# Patient Record
Sex: Female | Born: 1960 | Race: White | Hispanic: No | Marital: Married | Smoking: Never smoker
Health system: Southern US, Community
[De-identification: ages and names within clinical notes are randomized; demographics above are authoritative.]

## PROBLEM LIST (undated history)

## (undated) DIAGNOSIS — I10 Essential (primary) hypertension: Secondary | ICD-10-CM

## (undated) DIAGNOSIS — C801 Malignant (primary) neoplasm, unspecified: Secondary | ICD-10-CM

## (undated) DIAGNOSIS — N3281 Overactive bladder: Secondary | ICD-10-CM

## (undated) DIAGNOSIS — M1990 Unspecified osteoarthritis, unspecified site: Secondary | ICD-10-CM

## (undated) HISTORY — PX: RECONSTRUCTION BREAST W/ LATISSIMUS DORSI FLAP: SUR1078

## (undated) HISTORY — PX: TOTAL KNEE ARTHROPLASTY: SHX125

## (undated) HISTORY — PX: MASTECTOMY: SHX3

## (undated) HISTORY — PX: TOTAL HIP ARTHROPLASTY: SHX124

## (undated) HISTORY — PX: LUMBAR FUSION: SHX111

## (undated) HISTORY — PX: CERVICAL SPINE SURGERY: SHX589

---

## 1998-01-04 ENCOUNTER — Other Ambulatory Visit: Admission: RE | Admit: 1998-01-04 | Discharge: 1998-01-04 | Payer: Self-pay | Admitting: *Deleted

## 1999-02-01 ENCOUNTER — Other Ambulatory Visit: Admission: RE | Admit: 1999-02-01 | Discharge: 1999-02-01 | Payer: Self-pay | Admitting: *Deleted

## 2000-03-03 ENCOUNTER — Other Ambulatory Visit: Admission: RE | Admit: 2000-03-03 | Discharge: 2000-03-03 | Payer: Self-pay | Admitting: *Deleted

## 2001-05-05 ENCOUNTER — Other Ambulatory Visit: Admission: RE | Admit: 2001-05-05 | Discharge: 2001-05-05 | Payer: Self-pay | Admitting: *Deleted

## 2002-05-06 ENCOUNTER — Other Ambulatory Visit: Admission: RE | Admit: 2002-05-06 | Discharge: 2002-05-06 | Payer: Self-pay | Admitting: *Deleted

## 2003-05-03 ENCOUNTER — Other Ambulatory Visit: Admission: RE | Admit: 2003-05-03 | Discharge: 2003-05-03 | Payer: Self-pay | Admitting: *Deleted

## 2004-08-01 ENCOUNTER — Other Ambulatory Visit: Admission: RE | Admit: 2004-08-01 | Discharge: 2004-08-01 | Payer: Self-pay | Admitting: *Deleted

## 2005-08-14 ENCOUNTER — Other Ambulatory Visit: Admission: RE | Admit: 2005-08-14 | Discharge: 2005-08-14 | Payer: Self-pay | Admitting: *Deleted

## 2006-08-12 ENCOUNTER — Other Ambulatory Visit: Admission: RE | Admit: 2006-08-12 | Discharge: 2006-08-12 | Payer: Self-pay | Admitting: *Deleted

## 2020-12-08 ENCOUNTER — Other Ambulatory Visit: Payer: Self-pay | Admitting: Orthopaedic Surgery

## 2020-12-08 DIAGNOSIS — M25512 Pain in left shoulder: Secondary | ICD-10-CM

## 2020-12-16 ENCOUNTER — Ambulatory Visit
Admission: RE | Admit: 2020-12-16 | Discharge: 2020-12-16 | Disposition: A | Discharge status: Home / Self Care | Payer: Self-pay | Source: Ambulatory Visit | Attending: Orthopaedic Surgery | Admitting: Orthopaedic Surgery

## 2020-12-16 ENCOUNTER — Other Ambulatory Visit: Payer: Self-pay

## 2020-12-16 DIAGNOSIS — M25512 Pain in left shoulder: Secondary | ICD-10-CM

## 2021-01-30 ENCOUNTER — Encounter (HOSPITAL_BASED_OUTPATIENT_CLINIC_OR_DEPARTMENT_OTHER): Payer: Self-pay | Admitting: Orthopaedic Surgery

## 2021-01-30 ENCOUNTER — Other Ambulatory Visit: Payer: Self-pay

## 2021-02-06 ENCOUNTER — Encounter (HOSPITAL_BASED_OUTPATIENT_CLINIC_OR_DEPARTMENT_OTHER)
Admission: RE | Admit: 2021-02-06 | Discharge: 2021-02-06 | Disposition: A | Discharge status: Home / Self Care | Payer: Medicare Other | Source: Ambulatory Visit | Attending: Orthopaedic Surgery | Admitting: Orthopaedic Surgery

## 2021-02-06 DIAGNOSIS — I10 Essential (primary) hypertension: Secondary | ICD-10-CM | POA: Diagnosis not present

## 2021-02-06 DIAGNOSIS — M19012 Primary osteoarthritis, left shoulder: Secondary | ICD-10-CM | POA: Diagnosis not present

## 2021-02-06 DIAGNOSIS — Z01818 Encounter for other preprocedural examination: Secondary | ICD-10-CM | POA: Diagnosis not present

## 2021-02-06 DIAGNOSIS — Z96652 Presence of left artificial knee joint: Secondary | ICD-10-CM | POA: Diagnosis not present

## 2021-02-06 DIAGNOSIS — Z79899 Other long term (current) drug therapy: Secondary | ICD-10-CM | POA: Diagnosis not present

## 2021-02-06 DIAGNOSIS — S42142A Displaced fracture of glenoid cavity of scapula, left shoulder, initial encounter for closed fracture: Secondary | ICD-10-CM | POA: Diagnosis not present

## 2021-02-06 DIAGNOSIS — Z9889 Other specified postprocedural states: Secondary | ICD-10-CM | POA: Diagnosis not present

## 2021-02-06 DIAGNOSIS — C50912 Malignant neoplasm of unspecified site of left female breast: Secondary | ICD-10-CM | POA: Diagnosis not present

## 2021-02-06 DIAGNOSIS — Z96641 Presence of right artificial hip joint: Secondary | ICD-10-CM | POA: Diagnosis not present

## 2021-02-06 DIAGNOSIS — X58XXXA Exposure to other specified factors, initial encounter: Secondary | ICD-10-CM | POA: Diagnosis not present

## 2021-02-06 DIAGNOSIS — Z79811 Long term (current) use of aromatase inhibitors: Secondary | ICD-10-CM | POA: Diagnosis not present

## 2021-02-06 DIAGNOSIS — Z7983 Long term (current) use of bisphosphonates: Secondary | ICD-10-CM | POA: Diagnosis not present

## 2021-02-06 LAB — BASIC METABOLIC PANEL
Anion gap: 9 (ref 5–15)
BUN: 15 mg/dL (ref 8–23)
CO2: 27 mmol/L (ref 22–32)
Calcium: 9.8 mg/dL (ref 8.9–10.3)
Chloride: 94 mmol/L — ABNORMAL LOW (ref 98–111)
Creatinine, Ser: 0.84 mg/dL (ref 0.44–1.00)
GFR, Estimated: >60 mL/min (ref >60)
Glucose, Bld: 96 mg/dL (ref 70–99)
Potassium: 3.9 mmol/L (ref 3.5–5.1)
Sodium: 130 mmol/L — ABNORMAL LOW (ref 135–145)

## 2021-02-06 LAB — SURGICAL PCR SCREEN
MRSA, PCR: NEGATIVE
Staphylococcus aureus: NEGATIVE

## 2021-02-06 NOTE — Progress Notes (Signed)
      Enhanced Recovery after Surgery for Orthopedics Enhanced Recovery after Surgery is a protocol used to improve the stress on your body and your recovery after surgery.  Patient Instructions  The night before surgery:  No food after midnight. ONLY clear liquids after midnight  The day of surgery (if you do NOT have diabetes):  Drink ONE (1) Pre-Surgery Clear Ensure as directed.   This drink was given to you during your hospital  pre-op appointment visit. The pre-op nurse will instruct you on the time to drink the  Pre-Surgery Ensure depending on your surgery time. Finish the drink at the designated time by the pre-op nurse.  Nothing else to drink after completing the  Pre-Surgery Clear Ensure.  The day of surgery (if you have diabetes): Drink ONE (1) Gatorade 2 (G2) as directed. This drink was given to you during your hospital  pre-op appointment visit.  The pre-op nurse will instruct you on the time to drink the   Gatorade 2 (G2) depending on your surgery time. Color of the Gatorade may vary. Red is not allowed. Nothing else to drink after completing the  Gatorade 2 (G2).         If you have questions, please contact your surgeon's office. Surgical soap given with instructions, pt verbalized understanding. Benzoyl peroxide gel given with written instructions, pt verbalized understanding.

## 2021-02-06 NOTE — Progress Notes (Signed)
Sent text reminding pt to come in for lab work today.

## 2021-02-06 NOTE — H&P (Signed)
PREOPERATIVE H&P  Chief Complaint: DJD LEFT SHOULDER  HPI: Pam Jarvis is a 60 y.o. female who is scheduled for Procedure(s): REVERSE SHOULDER ARTHROPLASTY.   Patient has a past medical history significant for breast cancer, HTN.   Patient has a reported history of bilateral rotator cuff repairs done by Dr. Jolinda Croak in [facility].  She has had trouble with her shoulders for many years.  It has been worsening over the last few years. She is an avid [employer] cabin [employer]. She has had injections to her bilateral shoulders, which only provides minimal improvement. She is frustrated by her shoulder.  Her symptoms are rated as moderate to severe, and have been worsening.  This is significantly impairing activities of daily living.    Please see clinic note for further details on this patient's care.    She has elected for surgical management.   Past Medical History:  Diagnosis Date   Cancer ([place])    left breast cancer-bil mastectomies   Hypertension    OAB (overactive bladder)    Osteoarthritis    Past Surgical History:  Procedure Laterality Date   CERVICAL SPINE SURGERY     LUMBAR FUSION     MASTECTOMY Bilateral    RECONSTRUCTION BREAST W/ LATISSIMUS DORSI FLAP     TOTAL HIP ARTHROPLASTY Right    TOTAL KNEE ARTHROPLASTY Left    Social History   Socioeconomic History   Marital status: Married    Spouse name: Not on file   Number of children: Not on file   Years of education: Not on file   Highest education level: Not on file  Occupational History   Not on file  Tobacco Use   Smoking status: Never   Smokeless tobacco: Never  Vaping Use   Vaping Use: Never used  Substance and Sexual Activity   Alcohol use: Yes    Comment: social   Drug use: Never   Sexual activity: Yes    Birth control/protection: Post-menopausal  Other Topics Concern   Not on file  Social History Narrative   Not on file   Social Determinants of Health   Financial Resource Strain: Not on file   Food Insecurity: Not on file  Transportation Needs: Not on file  Physical Activity: Not on file  Stress: Not on file  Social Connections: Not on file   History reviewed. No pertinent family history. No Known Allergies Prior to Admission medications   Medication Sig Start Date End Date Taking? Authorizing Provider  amLODipine (NORVASC) 5 MG tablet Take 5 mg by mouth daily.   Yes [provider]  doxepin (SINEQUAN) 25 MG capsule Take 25 mg by mouth at bedtime.   Yes [provider]  exemestane (AROMASIN) 25 MG tablet Take 25 mg by mouth daily after breakfast.   Yes [provider]  ibandronate (BONIVA) 150 MG tablet Take 150 mg by mouth every 30 (thirty) days. Take in the morning with a full glass of water, on an empty stomach, and do not take anything else by mouth or lie down for the next 30 min.   Yes [provider]  ibuprofen (ADVIL) 800 MG tablet Take 800 mg by mouth every 8 (eight) hours as needed.   Yes [provider]  metoprolol succinate (TOPROL-XL) 25 MG 24 hr tablet Take 12.5 mg by mouth daily.   Yes [provider]  oxybutynin (DITROPAN-XL) 10 MG 24 hr tablet Take 10 mg by mouth at bedtime.   Yes [provider]  quinapril (ACCUPRIL) 20 MG tablet Take 20 mg by mouth at bedtime.   Yes [provider]  triamterene-hydrochlorothiazide (DYAZIDE) 37.5-25 MG capsule Take 1 capsule by mouth daily.   Yes [provider]  venlafaxine (EFFEXOR) 100 MG tablet Take 225 mg by mouth daily.   Yes [provider]  zolpidem (AMBIEN) 10 MG tablet Take 20 mg by mouth at bedtime as needed for sleep.   Yes [provider]    ROS: All other systems have been reviewed and were otherwise negative with the exception of those mentioned in the HPI and as above.  Physical Exam: General: Alert, no acute distress Cardiovascular: No pedal edema Respiratory: No cyanosis, no use of accessory musculature GI:  No organomegaly, abdomen is soft and non-tender Skin: No lesions in the area of chief complaint Neurologic: Sensation intact distally Psychiatric: Patient is competent for consent with normal mood and affect Lymphatic: No axillary or cervical lymphadenopathy  MUSCULOSKELETAL:  Range of motion of bilateral shoulders is to 140 degrees.  External rotation to 40 degrees on the right, 30 degrees on the left.  Internal rotation to T8 on the right and T5 on the left.  Cuff strength is 5/5 bilaterally.    Imaging: X-rays demonstrate end stage glenohumeral arthritis of bilateral shoulders with no obvious proximal migration.  CT of the left shoulder demonstrates severe glenohumeral arthritis  Assessment: DJD LEFT SHOULDER  Plan: Plan for Procedure(s): REVERSE SHOULDER ARTHROPLASTY  The risks benefits and alternatives were discussed with the patient including but not limited to the risks of nonoperative treatment, versus surgical intervention including infection, bleeding, nerve injury,  blood clots, cardiopulmonary complications, morbidity, mortality, among others, and they were willing to proceed.   We additionally specifically discussed risks of axillary nerve injury, infection, periprosthetic fracture, continued pain and longevity of implants prior to beginning procedure.    Patient will be closely monitored in PACU for medical stabilization and pain control. If found stable in PACU, patient may be discharged home with outpatient follow-up. If any concerns regarding patient's stabilization patient will be admitted for observation after surgery. The patient is planning to be discharged home with outpatient PT.   The patient acknowledged the explanation, agreed to proceed with the plan and consent was signed.   Operative Plan: Left anatomic versus reverse total shoulder arthroplasty  Discharge Medications: standard DVT Prophylaxis: aspirin Physical Therapy: outpatient PT Special Discharge  needs: sling. Iceman.   Ethelda Chick, PA-C  02/06/2021 9:31 PM

## 2021-02-07 ENCOUNTER — Encounter (HOSPITAL_BASED_OUTPATIENT_CLINIC_OR_DEPARTMENT_OTHER)
Admission: RE | Disposition: A | Discharge status: Home / Self Care | Payer: Self-pay | Source: Home / Self Care | Attending: Orthopaedic Surgery

## 2021-02-07 ENCOUNTER — Encounter (HOSPITAL_BASED_OUTPATIENT_CLINIC_OR_DEPARTMENT_OTHER): Payer: Self-pay | Admitting: Orthopaedic Surgery

## 2021-02-07 ENCOUNTER — Ambulatory Visit (HOSPITAL_BASED_OUTPATIENT_CLINIC_OR_DEPARTMENT_OTHER): Payer: Medicare Other | Admitting: Certified Registered"

## 2021-02-07 ENCOUNTER — Ambulatory Visit (HOSPITAL_COMMUNITY): Payer: Medicare Other

## 2021-02-07 ENCOUNTER — Other Ambulatory Visit: Payer: Self-pay

## 2021-02-07 ENCOUNTER — Ambulatory Visit (HOSPITAL_BASED_OUTPATIENT_CLINIC_OR_DEPARTMENT_OTHER)
Admission: RE | Admit: 2021-02-07 | Discharge: 2021-02-07 | Disposition: A | Discharge status: Home / Self Care | Payer: Medicare Other | Attending: Orthopaedic Surgery | Admitting: Orthopaedic Surgery

## 2021-02-07 DIAGNOSIS — Z96641 Presence of right artificial hip joint: Secondary | ICD-10-CM | POA: Insufficient documentation

## 2021-02-07 DIAGNOSIS — I10 Essential (primary) hypertension: Secondary | ICD-10-CM | POA: Diagnosis not present

## 2021-02-07 DIAGNOSIS — Z96652 Presence of left artificial knee joint: Secondary | ICD-10-CM | POA: Insufficient documentation

## 2021-02-07 DIAGNOSIS — C50912 Malignant neoplasm of unspecified site of left female breast: Secondary | ICD-10-CM | POA: Diagnosis not present

## 2021-02-07 DIAGNOSIS — Z79899 Other long term (current) drug therapy: Secondary | ICD-10-CM | POA: Insufficient documentation

## 2021-02-07 DIAGNOSIS — M19012 Primary osteoarthritis, left shoulder: Secondary | ICD-10-CM | POA: Insufficient documentation

## 2021-02-07 DIAGNOSIS — Z79811 Long term (current) use of aromatase inhibitors: Secondary | ICD-10-CM | POA: Insufficient documentation

## 2021-02-07 DIAGNOSIS — X58XXXA Exposure to other specified factors, initial encounter: Secondary | ICD-10-CM | POA: Insufficient documentation

## 2021-02-07 DIAGNOSIS — Z09 Encounter for follow-up examination after completed treatment for conditions other than malignant neoplasm: Secondary | ICD-10-CM

## 2021-02-07 DIAGNOSIS — S42142A Displaced fracture of glenoid cavity of scapula, left shoulder, initial encounter for closed fracture: Secondary | ICD-10-CM | POA: Insufficient documentation

## 2021-02-07 DIAGNOSIS — Z7983 Long term (current) use of bisphosphonates: Secondary | ICD-10-CM | POA: Insufficient documentation

## 2021-02-07 DIAGNOSIS — Z9889 Other specified postprocedural states: Secondary | ICD-10-CM | POA: Insufficient documentation

## 2021-02-07 HISTORY — DX: Essential (primary) hypertension: I10

## 2021-02-07 HISTORY — DX: Overactive bladder: N32.81

## 2021-02-07 HISTORY — PX: REVERSE SHOULDER ARTHROPLASTY: SHX5054

## 2021-02-07 HISTORY — DX: Malignant (primary) neoplasm, unspecified: C80.1

## 2021-02-07 HISTORY — DX: Unspecified osteoarthritis, unspecified site: M19.90

## 2021-02-07 SURGERY — ARTHROPLASTY, SHOULDER, TOTAL, REVERSE
Anesthesia: General | Site: Shoulder | Laterality: Left

## 2021-02-07 MED ORDER — ONDANSETRON HCL 4 MG/2ML IJ SOLN
INTRAMUSCULAR | Status: AC
  Filled 2021-02-07: qty 2

## 2021-02-07 MED ORDER — GABAPENTIN 300 MG PO CAPS
ORAL_CAPSULE | ORAL | Status: AC
  Filled 2021-02-07: qty 1

## 2021-02-07 MED ORDER — BUPIVACAINE HCL (PF) 0.5 % IJ SOLN
INTRAMUSCULAR | Status: DC | PRN
  Administered 2021-02-07: 20 mL via PERINEURAL

## 2021-02-07 MED ORDER — CEFAZOLIN SODIUM-DEXTROSE 2-4 GM/100ML-% IV SOLN
INTRAVENOUS | Status: AC
  Filled 2021-02-07: qty 100

## 2021-02-07 MED ORDER — GABAPENTIN 100 MG PO CAPS: 100.0000 mg | ORAL_CAPSULE | Freq: Three times a day (TID) | ORAL | 0 refills | Status: AC

## 2021-02-07 MED ORDER — LACTATED RINGERS IV SOLN: INTRAVENOUS | Status: DC

## 2021-02-07 MED ORDER — BUPIVACAINE LIPOSOME 1.3 % IJ SUSP
INTRAMUSCULAR | Status: DC | PRN
  Administered 2021-02-07: 10 mL via PERINEURAL

## 2021-02-07 MED ORDER — HYDROMORPHONE HCL 1 MG/ML IJ SOLN: 0.2500 mg | INTRAMUSCULAR | Status: DC | PRN

## 2021-02-07 MED ORDER — EPHEDRINE SULFATE 50 MG/ML IJ SOLN
INTRAMUSCULAR | Status: DC | PRN
  Administered 2021-02-07: 15 mg via INTRAVENOUS
  Administered 2021-02-07 (×2): 10 mg via INTRAVENOUS
  Administered 2021-02-07: 15 mg via INTRAVENOUS

## 2021-02-07 MED ORDER — FENTANYL CITRATE (PF) 100 MCG/2ML IJ SOLN
100.0000 ug | Freq: Once | INTRAMUSCULAR | Status: AC
  Administered 2021-02-07: 100 ug via INTRAVENOUS

## 2021-02-07 MED ORDER — VANCOMYCIN HCL 1000 MG IV SOLR
INTRAVENOUS | Status: DC | PRN
  Administered 2021-02-07: 1000 mg via TOPICAL

## 2021-02-07 MED ORDER — DEXAMETHASONE SODIUM PHOSPHATE 10 MG/ML IJ SOLN
INTRAMUSCULAR | Status: DC | PRN
  Administered 2021-02-07: 10 mg via INTRAVENOUS

## 2021-02-07 MED ORDER — ROCURONIUM BROMIDE 10 MG/ML (PF) SYRINGE
PREFILLED_SYRINGE | INTRAVENOUS | Status: AC
  Filled 2021-02-07: qty 10

## 2021-02-07 MED ORDER — PHENYLEPHRINE HCL (PRESSORS) 10 MG/ML IV SOLN
INTRAVENOUS | Status: DC | PRN
  Administered 2021-02-07 (×2): 120 ug via INTRAVENOUS
  Administered 2021-02-07: 40 ug via INTRAVENOUS
  Administered 2021-02-07: 120 ug via INTRAVENOUS

## 2021-02-07 MED ORDER — TRANEXAMIC ACID-NACL 1000-0.7 MG/100ML-% IV SOLN
INTRAVENOUS | Status: AC
  Filled 2021-02-07: qty 100

## 2021-02-07 MED ORDER — LACTATED RINGERS IV BOLUS
500.0000 mL | Freq: Once | INTRAVENOUS | Status: AC
  Administered 2021-02-07: 500 mL via INTRAVENOUS

## 2021-02-07 MED ORDER — LACTATED RINGERS IV BOLUS: 250.0000 mL | Freq: Once | INTRAVENOUS | Status: DC

## 2021-02-07 MED ORDER — LIDOCAINE 2% (20 MG/ML) 5 ML SYRINGE
INTRAMUSCULAR | Status: DC | PRN
  Administered 2021-02-07: 40 mg via INTRAVENOUS

## 2021-02-07 MED ORDER — OMEPRAZOLE 20 MG PO CPDR: 20.0000 mg | DELAYED_RELEASE_CAPSULE | Freq: Every day | ORAL | 0 refills | Status: AC

## 2021-02-07 MED ORDER — ASPIRIN 81 MG PO CHEW: 81.0000 mg | CHEWABLE_TABLET | Freq: Two times a day (BID) | ORAL | 0 refills | Status: AC

## 2021-02-07 MED ORDER — DEXAMETHASONE SODIUM PHOSPHATE 10 MG/ML IJ SOLN
INTRAMUSCULAR | Status: AC
  Filled 2021-02-07: qty 1

## 2021-02-07 MED ORDER — ACETAMINOPHEN 500 MG PO TABS
ORAL_TABLET | ORAL | Status: AC
  Filled 2021-02-07: qty 2

## 2021-02-07 MED ORDER — CEFAZOLIN SODIUM-DEXTROSE 2-4 GM/100ML-% IV SOLN
2.0000 g | INTRAVENOUS | Status: AC
  Administered 2021-02-07: 2 g via INTRAVENOUS

## 2021-02-07 MED ORDER — VANCOMYCIN HCL 1000 MG IV SOLR
INTRAVENOUS | Status: AC
  Filled 2021-02-07: qty 20

## 2021-02-07 MED ORDER — CELECOXIB 100 MG PO CAPS: 100.0000 mg | ORAL_CAPSULE | Freq: Two times a day (BID) | ORAL | 0 refills | Status: AC

## 2021-02-07 MED ORDER — BUPIVACAINE HCL (PF) 0.25 % IJ SOLN
INTRAMUSCULAR | Status: AC
  Filled 2021-02-07: qty 60

## 2021-02-07 MED ORDER — LIDOCAINE 2% (20 MG/ML) 5 ML SYRINGE
INTRAMUSCULAR | Status: AC
  Filled 2021-02-07: qty 5

## 2021-02-07 MED ORDER — MEPERIDINE HCL 25 MG/ML IJ SOLN: 6.2500 mg | INTRAMUSCULAR | Status: DC | PRN

## 2021-02-07 MED ORDER — SUGAMMADEX SODIUM 200 MG/2ML IV SOLN
INTRAVENOUS | Status: DC | PRN
  Administered 2021-02-07: 150 mg via INTRAVENOUS

## 2021-02-07 MED ORDER — ACETAMINOPHEN 500 MG PO TABS: 1000.0000 mg | ORAL_TABLET | Freq: Three times a day (TID) | ORAL | 0 refills | Status: AC

## 2021-02-07 MED ORDER — FENTANYL CITRATE (PF) 100 MCG/2ML IJ SOLN
INTRAMUSCULAR | Status: AC
  Filled 2021-02-07: qty 2

## 2021-02-07 MED ORDER — SODIUM CHLORIDE 0.9 % IV SOLN
INTRAVENOUS | Status: AC | PRN
  Administered 2021-02-07: 1000 mL

## 2021-02-07 MED ORDER — AMISULPRIDE (ANTIEMETIC) 5 MG/2ML IV SOLN: 10.0000 mg | Freq: Once | INTRAVENOUS | Status: DC | PRN

## 2021-02-07 MED ORDER — OXYCODONE HCL 5 MG PO TABS: 5.0000 mg | ORAL_TABLET | Freq: Once | ORAL | Status: DC | PRN

## 2021-02-07 MED ORDER — OXYCODONE HCL 5 MG PO TABS: ORAL_TABLET | ORAL | 0 refills | Status: AC

## 2021-02-07 MED ORDER — PHENYLEPHRINE HCL (PRESSORS) 10 MG/ML IV SOLN
INTRAVENOUS | Status: AC
  Filled 2021-02-07: qty 2

## 2021-02-07 MED ORDER — TRANEXAMIC ACID-NACL 1000-0.7 MG/100ML-% IV SOLN
1000.0000 mg | INTRAVENOUS | Status: AC
  Administered 2021-02-07: 1000 mg via INTRAVENOUS

## 2021-02-07 MED ORDER — PROMETHAZINE HCL 25 MG/ML IJ SOLN: 6.2500 mg | INTRAMUSCULAR | Status: DC | PRN

## 2021-02-07 MED ORDER — ONDANSETRON HCL 4 MG/2ML IJ SOLN
INTRAMUSCULAR | Status: DC | PRN
Start: 2021-02-07 — End: 2021-02-07
  Administered 2021-02-07: 4 mg via INTRAVENOUS

## 2021-02-07 MED ORDER — ONDANSETRON HCL 4 MG PO TABS: 4.0000 mg | ORAL_TABLET | Freq: Three times a day (TID) | ORAL | 0 refills | Status: AC | PRN

## 2021-02-07 MED ORDER — PROPOFOL 10 MG/ML IV BOLUS
INTRAVENOUS | Status: AC
  Filled 2021-02-07: qty 20

## 2021-02-07 MED ORDER — SODIUM CHLORIDE (PF) 0.9 % IJ SOLN
INTRAMUSCULAR | Status: AC
  Filled 2021-02-07: qty 20

## 2021-02-07 MED ORDER — OXYCODONE HCL 5 MG/5ML PO SOLN: 5.0000 mg | Freq: Once | ORAL | Status: DC | PRN

## 2021-02-07 MED ORDER — PROPOFOL 10 MG/ML IV BOLUS
INTRAVENOUS | Status: DC | PRN
  Administered 2021-02-07: 120 mg via INTRAVENOUS

## 2021-02-07 MED ORDER — MIDAZOLAM HCL 2 MG/2ML IJ SOLN
INTRAMUSCULAR | Status: AC
  Filled 2021-02-07: qty 2

## 2021-02-07 MED ORDER — MIDAZOLAM HCL 2 MG/2ML IJ SOLN
2.0000 mg | Freq: Once | INTRAMUSCULAR | Status: AC
  Administered 2021-02-07: 2 mg via INTRAVENOUS

## 2021-02-07 MED ORDER — GABAPENTIN 300 MG PO CAPS
300.0000 mg | ORAL_CAPSULE | Freq: Once | ORAL | Status: AC
  Administered 2021-02-07: 300 mg via ORAL

## 2021-02-07 MED ORDER — ACETAMINOPHEN 500 MG PO TABS
1000.0000 mg | ORAL_TABLET | Freq: Once | ORAL | Status: AC
  Administered 2021-02-07: 1000 mg via ORAL

## 2021-02-07 MED ORDER — ROCURONIUM BROMIDE 100 MG/10ML IV SOLN
INTRAVENOUS | Status: DC | PRN
  Administered 2021-02-07: 60 mg via INTRAVENOUS

## 2021-02-07 MED ORDER — PHENYLEPHRINE HCL-NACL 20-0.9 MG/250ML-% IV SOLN
INTRAVENOUS | Status: DC | PRN
  Administered 2021-02-07: 50 ug/min via INTRAVENOUS

## 2021-02-07 MED ORDER — EPHEDRINE 5 MG/ML INJ
INTRAVENOUS | Status: AC
  Filled 2021-02-07: qty 10

## 2021-02-07 SURGICAL SUPPLY — 71 items
AID PSTN UNV HD RSTRNT DISP (MISCELLANEOUS) ×1
APL PRP STRL LF DISP 70% ISPRP (MISCELLANEOUS) ×1
BASEPLATE GLENOSPHERE 25 STD (Miscellaneous) ×2 IMPLANT
BIT DRILL 3.2 PERIPHERAL SCREW (BIT) ×2 IMPLANT
BLADE HEX COATED 2.75 (ELECTRODE) IMPLANT
BLADE SAW SAG 73X25 THK (BLADE) ×1
BLADE SAW SGTL 73X25 THK (BLADE) ×1 IMPLANT
BLADE SURG 10 STRL SS (BLADE) IMPLANT
BLADE SURG 15 STRL LF DISP TIS (BLADE) IMPLANT
BLADE SURG 15 STRL SS (BLADE)
BNDG COHESIVE 4X5 TAN ST LF (GAUZE/BANDAGES/DRESSINGS) IMPLANT
BRUSH SCRUB EZ PLAIN DRY (MISCELLANEOUS) ×2 IMPLANT
BSPLAT GLND STD 25 RVRS SHLDR (Miscellaneous) ×1 IMPLANT
CHLORAPREP W/TINT 26 (MISCELLANEOUS) ×2 IMPLANT
CLSR STERI-STRIP ANTIMIC 1/2X4 (GAUZE/BANDAGES/DRESSINGS) ×2 IMPLANT
COOLER ICEMAN CLASSIC (MISCELLANEOUS) ×2 IMPLANT
COVER BACK TABLE 60X90IN (DRAPES) ×2 IMPLANT
COVER MAYO STAND STRL (DRAPES) ×2 IMPLANT
DECANTER SPIKE VIAL GLASS SM (MISCELLANEOUS) IMPLANT
DRAPE IMP U-DRAPE 54X76 (DRAPES) ×2 IMPLANT
DRAPE INCISE IOBAN 66X45 STRL (DRAPES) ×2 IMPLANT
DRAPE U-SHAPE 76X120 STRL (DRAPES) ×4 IMPLANT
DRSG AQUACEL AG ADV 3.5X 6 (GAUZE/BANDAGES/DRESSINGS) ×2 IMPLANT
ELECT BLADE 4.0 EZ CLEAN MEGAD (MISCELLANEOUS) ×2
ELECT REM PT RETURN 9FT ADLT (ELECTROSURGICAL) ×2
ELECTRODE BLDE 4.0 EZ CLN MEGD (MISCELLANEOUS) ×1 IMPLANT
ELECTRODE REM PT RTRN 9FT ADLT (ELECTROSURGICAL) ×1 IMPLANT
FACESHIELD WRAPAROUND (MASK) ×2 IMPLANT
GLENOSPHERE REV SHOULDER 36 (Joint) ×2 IMPLANT
GLOVE SRG 8 PF TXTR STRL LF DI (GLOVE) ×1 IMPLANT
GLOVE SURG ENC MOIS LTX SZ6.5 (GLOVE) ×6 IMPLANT
GLOVE SURG LTX SZ8 (GLOVE) ×4 IMPLANT
GLOVE SURG UNDER POLY LF SZ6.5 (GLOVE) ×2 IMPLANT
GLOVE SURG UNDER POLY LF SZ7 (GLOVE) ×4 IMPLANT
GLOVE SURG UNDER POLY LF SZ8 (GLOVE) ×2
GOWN STRL REUS W/ TWL LRG LVL3 (GOWN DISPOSABLE) ×2 IMPLANT
GOWN STRL REUS W/TWL LRG LVL3 (GOWN DISPOSABLE) ×4
GOWN STRL REUS W/TWL XL LVL3 (GOWN DISPOSABLE) ×2 IMPLANT
GUIDEWIRE GLENOID 2.5X220 (WIRE) ×2 IMPLANT
HANDPIECE INTERPULSE COAX TIP (DISPOSABLE) ×2
IMPL REVERSE SHOULDER 0X3.5 (Shoulder) ×1 IMPLANT
IMPLANT REVERSE SHOULDER 0X3.5 (Shoulder) ×2 IMPLANT
INSERT REV KIT SHOULDER 9X36 (Joint) ×2 IMPLANT
KIT STABILIZATION SHOULDER (MISCELLANEOUS) ×2 IMPLANT
MANIFOLD NEPTUNE II (INSTRUMENTS) ×2 IMPLANT
PACK BASIN DAY SURGERY FS (CUSTOM PROCEDURE TRAY) ×2 IMPLANT
PACK SHOULDER (CUSTOM PROCEDURE TRAY) ×2 IMPLANT
PAD COLD SHLDR WRAP-ON (PAD) ×2 IMPLANT
PAD ORTHO SHOULDER 7X19 LRG (SOFTGOODS) ×2 IMPLANT
PENCIL SMOKE EVACUATOR (MISCELLANEOUS) ×2 IMPLANT
RESTRAINT HEAD UNIVERSAL NS (MISCELLANEOUS) ×2 IMPLANT
SCREW 5.0X18 (Screw) ×2 IMPLANT
SCREW BONE 6.5X40 SM (Screw) ×2 IMPLANT
SCREW PERIPHERAL 30 (Screw) ×4 IMPLANT
SET HNDPC FAN SPRY TIP SCT (DISPOSABLE) ×1 IMPLANT
SHEET MEDIUM DRAPE 40X70 STRL (DRAPES) ×2 IMPLANT
SLEEVE SCD COMPRESS KNEE MED (STOCKING) ×2 IMPLANT
SPONGE T-LAP 18X18 ~~LOC~~+RFID (SPONGE) ×2 IMPLANT
STEM HUMERAL SZ2B STND 70 PTC (Stem) ×2 IMPLANT
STEM HUMERAL SZ2BSTD 70 PTC (Stem) ×1 IMPLANT
SUT ETHIBOND 2 V 37 (SUTURE) ×2 IMPLANT
SUT ETHIBOND NAB CT1 #1 30IN (SUTURE) ×2 IMPLANT
SUT FIBERWIRE #5 38 CONV NDL (SUTURE) ×8
SUT MNCRL AB 4-0 PS2 18 (SUTURE) ×2 IMPLANT
SUT VIC AB 0 CT1 27 (SUTURE)
SUT VIC AB 0 CT1 27XCR 8 STRN (SUTURE) IMPLANT
SUT VIC AB 3-0 SH 27 (SUTURE) ×2
SUT VIC AB 3-0 SH 27X BRD (SUTURE) ×1 IMPLANT
SUTURE FIBERWR #5 38 CONV NDL (SUTURE) ×4 IMPLANT
TOWEL GREEN STERILE FF (TOWEL DISPOSABLE) ×6 IMPLANT
TUBE SUCTION HIGH CAP CLEAR NV (SUCTIONS) ×2 IMPLANT

## 2021-02-07 NOTE — Transfer of Care (Signed)
Immediate Anesthesia Transfer of Care Note  Patient: Pam Jarvis  Procedure(s) Performed: REVERSE SHOULDER ARTHROPLASTY (Left: Shoulder)  Patient Location: PACU  Anesthesia Type:GA combined with regional for post-op pain  Level of Consciousness: drowsy  Airway & Oxygen Therapy: Patient Spontanous Breathing and Patient connected to face mask oxygen  Post-op Assessment: Report given to RN and Post -op Vital signs reviewed and stable  Post vital signs: Reviewed and stable  Last Vitals:  Vitals Value Taken Time  BP    Temp    Pulse 68 02/07/21 1009  Resp 16 02/07/21 1009  SpO2 100 % 02/07/21 1009  Vitals shown include unvalidated device data.  Last Pain:  Vitals:   02/07/21 0717  TempSrc: Oral  PainSc: 0-No pain      Patients Stated Pain Goal: 3 (07/04/92 5859)  Complications: No notable events documented.

## 2021-02-07 NOTE — Op Note (Signed)
Orthopaedic Surgery Operative Note (CSN: [id number])  Pam Jarvis  [birth year] Date of Surgery: 02/07/2021   Diagnoses:  Left shoulder end-stage glenohumeral arthritis with previous cuff repair and anterior and posterior glenoid fractures  Procedure: Left reverse total Shoulder Arthroplasty   Operative Finding Successful completion of planned procedure.  Patient's glenoid had fractured off the anterior 20% in the posterior 10% or so.  She had significantly decreased her overall glenoid surface area.  We were able to obtain good fixation with an intact vault and good center screw fixation.  Her bone was extremely sclerotic and we had to clean her drill bits multiple times to pass them.  With that her superior locking screw had such intense resistance while being put in that the screw itself snapped.  We were able to remove any loose fragments however it was clear that the shaft of the screw was buried in the bone and it would be to the significant detriment of the patient to remove it.  We had multiple other screw options to be used and we did not feel that this would change the patient's outcomes.  Patient had a history of a possible cancer that the patient reported that had been debunked and so we felt that it was appropriate to send the head for pathology.  We had no signs of any cancerous lesions throughout our approach.  Post-operative plan: The patient will be NWB in sling.  The patient will be will be discharged from PACU if continues to be stable as was plan prior to surgery.  DVT prophylaxis Aspirin 81 mg twice daily for 6 weeks.  Pain control with PRN pain medication preferring oral medicines.  Follow up plan will be scheduled in approximately 7 days for incision check and XR.  Physical therapy to start after first visit.  Implants: Tornier size 2B short flex implant, 0 high offset tray with a 36+9 polyethylene, 36 standard glenosphere with a 25 standard baseplate and a 40 center screw.   2 peripheral screws inferior and posterior with the superior locking screw fracturing during implantation.  Post-Op Diagnosis: Same Surgeons:Primary: Hiram Gash, MD Assistants:Caroline McBane PA-C Location: [place] OR ROOM 6 Anesthesia: General with Exparel Interscalene Antibiotics: Ancef 2g preop, Vancomycin 1089m locally Tourniquet time: None Estimated Blood Loss: 1732Complications: None Specimens: 1 for pathology from the humeral head Implants: Implant Name Type Inv. Item Serial No. Manufacturer Lot No. LRB No. Used Action  BASEPLATE GLENOSPHERE 220URSTD - [id number] BASEPLATE GLENOSPHERE 283TDSTD [id number]  Left 1 Implanted  SCREW BONE 6.5X40 SM - [id number] SCREW BONE 6.5X40 SM  [employer] STERILIZED ON SET Left 1 Implanted  GLENOSPHERE REV SHOULDER 36 - [id number] GLENOSPHERE REV SHOULDER 36 [id number]  Left 1 Implanted  SCREW PERIPHERAL 30 - [id number] SCREW PERIPHERAL 30  [employer] STERILIZED ON SET Left 2 Implanted  SCREW 5.0X18 - [id number] SCREW 5.0X18  [employer] STERILIZED ON SET Left 1 Implanted  IMPLANT REVERSE SHOULDER 0X3.5 - [id number] IMPLANT REVERSE SHOULDER 0X3.5 [id number]  Left 1 Implanted  INSERT REV KIT SHOULDER 9X36 - [id number] INSERT REV KIT SHOULDER 9X36 [id number] [employer]  Left 1 Implanted  STEM HUMERAL SZ2B STND 70 PTC - [id number] STEM HUMERAL SZ2B STND 70 PTC [id number] [employer]  Left 1 Implanted    Indications for Surgery:   Pam BEMANis a 60y.o. female with end-stage glenohumeral arthritis with a previous reported history  of a cuff repair.  Benefits and risks of operative and nonoperative management were discussed prior to surgery with patient/guardian(s) and informed consent form was completed.  Infection and need for further surgery were discussed as was prosthetic stability and cuff issues.  We additionally specifically discussed risks of  axillary nerve injury, infection, periprosthetic fracture, continued pain and longevity of implants prior to beginning procedure.      Procedure:   The patient was identified in the preoperative holding area where the surgical site was marked. Block placed by anesthesia with exparel.  The patient was taken to the OR where a procedural timeout was called and the above noted anesthesia was induced.  The patient was positioned beachchair on allen table with spider arm positioner.  Preoperative antibiotics were dosed.  The patient's left shoulder was prepped and draped in the usual sterile fashion.  A second preoperative timeout was called.       Standard deltopectoral approach was performed with a #10 blade. We dissected down to the subcutaneous tissues and the cephalic vein was taken laterally with the deltoid. Clavipectoral fascia was incised in line with the incision. Deep retractors were placed. The long of the biceps tendon was identified and there was significant tenosynovitis present.  Tenodesis was performed to the pectoralis tendon with #2 Ethibond. The remaining biceps was followed up into the rotator interval where it was released.   The subscapularis was taken down in a full thickness layer with capsule along the humeral neck extending inferiorly around the humeral head. We continued releasing the capsule directly off of the osteophytes inferiorly all the way around the corner. This allowed [place] to dislocate the humeral head.   The humeral head had evidence of severe osteoarthritic wear with full-thickness cartilage loss and exposed subchondral bone. There was significant flattening of the humeral head.   The rotator cuff was carefully examined and noted to nutritionally torn and unable to be mobilized forward appropriate anatomic total shoulder arthroplasty.  This combined with her known glenoid fractures we felt that a reverse was in her best interest.  There were osteophytes along the  inferior humeral neck. The osteophytes were removed with an osteotome and a rongeur.  Osteophytes were removed with a rongeur and an osteotome and the anatomic neck was well visualized.     A humeral cutting guide was inserted down the intramedullary canal. The version was set at 20 of retroversion. Humeral osteotomy was performed with an oscillating saw. The head fragment was passed off the back table. A starter awl was used to open the humeral canal. We next used T-handle straight sound reamers to ream up to an appropriate fit. A chisel was used to remove proximal humeral bone. We then broached starting with a size one broach and broaching up to 2 which obtained an appropriate fit. The broach handle was removed. A cut protector was placed. The broach handle was removed and a cut protector was placed. The humerus was retracted posteriorly and we turned our attention to glenoid exposure.  The subscapularis was again identified and immediately we took care to palpate the axillary nerve anteriorly and verify its position with gentle palpation as well as the tug test.  We then released the SGHL with bovie cautery prior to placing a curved mayo at the junction of the anterior glenoid well above the axillary nerve and bluntly dissecting the subscapularis from the capsule.  We then carefully protected the axillary nerve as we gently released the inferior capsule to  fully mobilize the subscapularis.  An anterior deltoid retractor was then placed as well as a small Hohmann retractor superiorly.   The glenoid was inspected and had evidence of severe osteoarthritic wear with full-thickness cartilage loss and exposed subchondral bone.  There is significant anterior and posterior bone loss with a anterior glenoid fracture that involve the anterior 20% of the glenoid without encompassing the vault.  The posterior 10% or so was fractured off as well.  Remove these loose fragments anteriorly but the posterior aspect was  left alone.  The remaining labrum was removed circumferentially taking great care not to disrupt the posterior capsule.   The glenoid drill guide was placed and used to drill a guide pin in the center, inferior position. The glenoid face was then reamed concentrically over the guide wire. The center hole was drilled over the guidepin in a near anatomic angle of version. Next the glenoid vault was drilled back to a depth of 40 mm.  We tapped and then placed a 78m size baseplate with additional 074mlateralization was selected with a 6.5 mm x 40 mm length central screw.  The base plate was screwed into the glenoid vault obtaining secure fixation. We next placed superior and inferior locking screws for additional fixation though the superior locking screw fracture during insert.  We then placed a next a 36 mm glenosphere was selected and impacted onto the baseplate. The center screw was tightened.  We turned attention back to the humeral side. The cut protector was removed. We trialed with multiple size tray and polyethylene options and selected a 9 which provided good stability and range of motion without excess soft tissue tension. The offset was dialed in to match the normal anatomy. The shoulder was trialed.  There was good ROM in all planes and the shoulder was stable with no inferior translation.  The real humeral implants were opened after again confirming sizes.  The trial was removed. #5 Fiberwire x4 sutures passed through the humeral neck for subscap repair. The humeral component was press-fit obtaining a secure fit. A +0 high offset tray was selected and impacted onto the stem.  A 36+9 polyethylene liner was impacted onto the stem.  The joint was reduced and thoroughly irrigated with pulsatile lavage. Subscap was repaired back with #5 Fiberwire sutures through bone tunnels. Hemostasis was obtained. The deltopectoral interval was reapproximated with #1 Ethibond. The subcutaneous tissues were closed  with 2-0 Vicryl and the skin was closed with running monocryl.    The wounds were cleaned and dried and an Aquacel dressing was placed. The drapes taken down. The arm was placed into sling with abduction pillow. Patient was awakened, extubated, and transferred to the recovery room in stable condition. There were no intraoperative complications. The sponge, needle, and attention counts were  correct at the end of the case.       CaNoemi ChapelPA-C, present and scrubbed throughout the case, critical for completion in a timely fashion, and for retraction, instrumentation, closure.

## 2021-02-07 NOTE — Discharge Instructions (Addendum)
Ophelia Charter MD, MPH Noemi Chapel, PA-C [place] [address] [phone] (tel)   [phone] (fax)   [place] may leave the operative dressing in place until your follow-up appointment. KEEP THE INCISIONS CLEAN AND DRY. There may be a small amount of fluid/bleeding leaking at the surgical site. This is normal after surgery.  If it fills with liquid or blood please call us immediately to change it for you. Use the provided ice machine or Ice packs as often as possible for the first 3-4 days, then as needed for pain relief.   Keep a layer of cloth or a shirt between your skin and the cooling unit to prevent frost bite as it can get very cold.  SHOWERING: - You may shower on Post-Op Day #2.  - The dressing is water resistant but do not scrub it as it may start to peel up.   - You may remove the sling for showering, but keep a water resistant pillow under the arm to keep both the  elbow and shoulder away from the body (mimicking the abduction sling).  - Gently pat the area dry.  - Do not soak the shoulder in water. Do not go swimming in the pool or ocean until your incision has completely healed (about 4-6 weeks after surgery) - KEEP THE INCISIONS CLEAN AND DRY.  EXERCISES Wear the sling at all times You may remove the sling for showering, but keep the arm across the chest or in a secondary sling.    Accidental/Purposeful External Rotation and shoulder flexion (reaching behind you) is to be avoided at all costs for the first month. It is ok to come out of your sling if your are sitting and have assistance for eating.   Do not lift anything heavier than 1 pound until we discuss it further in clinic.   REGIONAL ANESTHESIA (NERVE BLOCKS) The anesthesia team may have performed a nerve block for you if safe in the setting of your care.  This is a great tool used to minimize pain.   Typically the block may start wearing off overnight but the long acting medicine may last for 3-4 days.  The nerve block wearing off can be a challenging period but please utilize your as needed pain medications to try and manage this period.    POST-OP MEDICATIONS- Multimodal approach to pain control In general your pain will be controlled with a combination of substances.  Prescriptions unless otherwise discussed are electronically sent to your pharmacy.  This is a carefully made plan we use to minimize narcotic use.     Celebrex - Anti-inflammatory medication taken on a scheduled basis Acetaminophen - Non-narcotic pain medicine taken on a scheduled basis  Gabapentin - this is a medication to help with pain, take on a scheduled basis Oxycodone - This is a strong narcotic, to be used only on an "as needed" basis for SEVERE pain. Aspirin 81mg  - This medicine is used to minimize the risk of blood clots after surgery. Omeprazole - daily medicine to protect your stomach while taking anti-inflammatories.   Zofran -  take as needed for nausea   FOLLOW-UP If you develop a Fever (>101.5), Redness or Drainage from the surgical incision site, please call our office to arrange for an evaluation. Please call the office to schedule a follow-up appointment for a wound check, 7-10 days post-operatively.  IF YOU HAVE ANY QUESTIONS, PLEASE  FEEL FREE TO CALL OUR OFFICE.  HELPFUL INFORMATION  If you had a block, it will wear off between 8-24 hrs postop typically.  This is period when your pain may go from nearly zero to the pain you would have had post-op without the block.  This is an abrupt transition but nothing dangerous is happening.  You may take an extra dose of narcotic when this happens.  Your arm will be in a sling following surgery. You will be in this sling for the next 4 weeks.  I will let you know the exact duration at your follow-up visit.  You may be more comfortable sleeping in a semi-seated  position the first few nights following surgery.  Keep a pillow propped under the elbow and forearm for comfort.  If you have a recliner type of chair it might be beneficial.  If not that is fine too, but it would be helpful to sleep propped up with pillows behind your operated shoulder as well under your elbow and forearm.  This will reduce pulling on the suture lines.  When dressing, put your operative arm in the sleeve first.  When getting undressed, take your operative arm out last.  Loose fitting, button-down shirts are recommended.  In most states it is against the law to drive while your arm is in a sling. And certainly against the law to drive while taking narcotics.  You may return to work/school in the next couple of days when you feel up to it. Desk work and typing in the sling is fine.  We suggest you use the pain medication the first night prior to going to bed, in order to ease any pain when the anesthesia wears off. You should avoid taking pain medications on an empty stomach as it will make you nauseous.  Do not drink alcoholic beverages or take illicit drugs when taking pain medications.  Pain medication may make you constipated.  Below are a few solutions to try in this order: Decrease the amount of pain medication if you aren't having pain. Drink lots of decaffeinated fluids. Drink prune juice and/or each dried prunes  If the first 3 don't work start with additional solutions Take Colace - an over-the-counter stool softener Take Senokot - an over-the-counter laxative Take Miralax - a stronger over-the-counter laxative   Dental Antibiotics:  In most cases prophylactic antibiotics for Dental procdeures after total joint surgery are not necessary.  Exceptions are as follows:  1. History of prior total joint infection  2. Severely immunocompromised (Organ Transplant, cancer chemotherapy, Rheumatoid biologic meds such as [place])  3. Poorly controlled diabetes (A1C &gt;  8.0, blood glucose over 200)  If you have one of these conditions, contact your surgeon for an antibiotic prescription, prior to your dental procedure.   For more information including helpful videos and documents visit our website:   [email or web address]   No Tylenol until 1:30pm   Post Anesthesia Home Care Instructions  Activity: Get plenty of rest for the remainder of the day. A responsible individual must stay with you for 24 hours following the procedure.  For the next 24 hours, DO NOT: -Drive a car -Paediatric nurse -Drink alcoholic beverages -Take any medication unless instructed by your physician -Make any legal decisions or sign important papers.  Meals: Start with liquid foods such as gelatin or soup. Progress to regular foods as tolerated. Avoid greasy, spicy, heavy foods. If nausea and/or vomiting occur, drink only clear liquids until the nausea and/or vomiting subsides.  Call your physician if vomiting continues.  Special Instructions/Symptoms: Your throat may feel dry or sore from the anesthesia or the breathing tube placed in your throat during surgery. If this causes discomfort, gargle with warm salt water. The discomfort should disappear within 24 hours.  If you had a scopolamine patch placed behind your ear for the management of post- operative nausea and/or vomiting:  1. The medication in the patch is effective for 72 hours, after which it should be removed.  Wrap patch in a tissue and discard in the trash. Wash hands thoroughly with soap and water. 2. You may remove the patch earlier than 72 hours if you experience unpleasant side effects which may include dry mouth, dizziness or visual disturbances. 3. Avoid touching the patch. Wash your hands with soap and water after contact with the patch.    Regional Anesthesia Blocks  1. Numbness or the inability to move the "blocked" extremity may last from 3-48 hours after placement.  The length of time depends on the medication injected and your individual response to the medication. If the numbness is not going away after 48 hours, call your surgeon.  2. The extremity that is blocked will need to be protected until the numbness is gone and the  Strength has returned. Because you cannot feel it, you will need to take extra care to avoid injury. Because it may be weak, you may have difficulty moving it or using it. You may not know what position it is in without looking at it while the block is in effect.  3. For blocks in the legs and feet, returning to weight bearing and walking needs to be done carefully. You will need to wait until the numbness is entirely gone and the strength has returned. You should be able to move your leg and foot normally before you try and bear weight or walk. You will need someone to be with you when you first try to ensure you do not fall and possibly risk injury.  4. Bruising and tenderness at the needle site are common side effects and will resolve in a few days.  5. Persistent numbness or new problems with movement should be communicated to the surgeon or the [facility] [phone] [place] [phone]). Information for Discharge Teaching: EXPAREL (bupivacaine liposome injectable suspension)   Your surgeon or anesthesiologist gave you EXPAREL(bupivacaine) to help control your pain after surgery.  EXPAREL is a local anesthetic that provides pain relief by numbing the tissue around the surgical site. EXPAREL is designed to release pain medication over time and can control pain for up to 72 hours. Depending on how you respond to EXPAREL, you may require less pain medication during your recovery.  Possible side effects: Temporary loss of sensation or ability to move in the area where bupivacaine was injected. Nausea, vomiting, constipation Rarely, numbness and tingling in your mouth or lips, lightheadedness, or  anxiety may occur. Call your doctor right away if you think you may be experiencing any of these sensations, or if you have other questions regarding possible side effects.  Follow all other discharge instructions given to you by your surgeon or nurse. Eat a healthy diet and drink plenty of water or other fluids.  If you return to the hospital for any reason within 96 hours following the administration of EXPAREL, it is important for health care providers to know that you have received this anesthetic. A teal colored band has been placed on your  arm with the date, time and amount of EXPAREL you have received in order to alert and inform your health care providers. Please leave this armband in place for the full 96 hours following administration, and then you may remove the band.

## 2021-02-07 NOTE — Interval H&P Note (Signed)
Call questions answered. 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

## 2021-02-07 NOTE — Anesthesia Postprocedure Evaluation (Signed)
Anesthesia Post Note  Patient: Pam Jarvis  Procedure(s) Performed: REVERSE SHOULDER ARTHROPLASTY (Left: Shoulder)     Patient location during evaluation: PACU Anesthesia Type: General Level of consciousness: awake and alert Pain management: pain level controlled Vital Signs Assessment: post-procedure vital signs reviewed and stable Respiratory status: spontaneous breathing, nonlabored ventilation and respiratory function stable Cardiovascular status: blood pressure returned to baseline and stable Postop Assessment: no apparent nausea or vomiting Anesthetic complications: no   No notable events documented.  Last Vitals:  Vitals:   02/07/21 1100 02/07/21 1122  BP: 116/79 117/81  Pulse: 79 96  Resp: 13 14  Temp:  36.7 C  SpO2: 97% 96%    Last Pain:  Vitals:   02/07/21 1109  TempSrc:   PainSc: 0-No pain                 Lynda Rainwater

## 2021-02-07 NOTE — Anesthesia Procedure Notes (Signed)
Anesthesia Regional Block: Interscalene brachial plexus block   Pre-Anesthetic Checklist: , timeout performed,  Correct Patient, Correct Site, Correct Laterality,  Correct Procedure, Correct Position, site marked,  Risks and benefits discussed,  Surgical consent,  Pre-op evaluation,  At surgeon's request and post-op pain management  Laterality: Left  Prep: chloraprep       Needles:  Injection technique: Single-shot  Needle Type: Stimiplex     Needle Length: 9cm  Needle Gauge: 21     Additional Needles:   Procedures:,,,, ultrasound used (permanent image in chart),,    Narrative:  Start time: 02/07/2021 8:11 AM End time: 02/07/2021 8:16 AM Injection made incrementally with aspirations every 5 mL.  Performed by: Personally  Anesthesiologist: Lynda Rainwater, MD

## 2021-02-07 NOTE — Anesthesia Preprocedure Evaluation (Signed)
Anesthesia Evaluation  Patient identified by MRN, date of birth, ID band Patient awake    Reviewed: Allergy & Precautions, NPO status , Patient's Chart, lab work & pertinent test results  Airway Mallampati: II  TM Distance: >3 FB Neck ROM: Full    Dental no notable dental hx.    Pulmonary neg pulmonary ROS,    Pulmonary exam normal breath sounds clear to auscultation       Cardiovascular hypertension, Pt. on medications negative cardio ROS Normal cardiovascular exam Rhythm:Regular Rate:Normal     Neuro/Psych negative neurological ROS  negative psych ROS   GI/Hepatic negative GI ROS, Neg liver ROS,   Endo/Other  negative endocrine ROS  Renal/GU negative Renal ROS  negative genitourinary   Musculoskeletal  (+) Arthritis , Osteoarthritis,    Abdominal   Peds negative pediatric ROS (+)  Hematology negative hematology ROS (+)   Anesthesia Other Findings   Reproductive/Obstetrics negative OB ROS                             Anesthesia Physical Anesthesia Plan  ASA: 2  Anesthesia Plan: General   Post-op Pain Management:  Regional for Post-op pain   Induction: Intravenous  PONV Risk Score and Plan: 3 and Ondansetron, Dexamethasone, Midazolam and Treatment may vary due to age or medical condition  Airway Management Planned: Oral ETT  Additional Equipment:   Intra-op Plan:   Post-operative Plan: Extubation in OR  Informed Consent: I have reviewed the patients History and Physical, chart, labs and discussed the procedure including the risks, benefits and alternatives for the proposed anesthesia with the patient or authorized representative who has indicated his/her understanding and acceptance.     Dental advisory given  Plan Discussed with: CRNA  Anesthesia Plan Comments:         Anesthesia Quick Evaluation

## 2021-02-07 NOTE — Anesthesia Procedure Notes (Signed)
Procedure Name: Intubation Date/Time: 02/07/2021 8:31 AM Performed by: Lavonia Dana, CRNA Pre-anesthesia Checklist: Patient identified, Emergency Drugs available, Suction available and Patient being monitored Patient Re-evaluated:Patient Re-evaluated prior to induction Oxygen Delivery Method: Circle system utilized Preoxygenation: Pre-oxygenation with 100% oxygen Induction Type: IV induction Ventilation: Mask ventilation without difficulty Laryngoscope Size: Mac and 3 Grade View: Grade I Tube type: Oral Tube size: 7.0 mm Number of attempts: 1 Airway Equipment and Method: Stylet and Bite block Placement Confirmation: ETT inserted through vocal cords under direct vision, positive ETCO2 and breath sounds checked- equal and bilateral Secured at: 21 cm Tube secured with: Tape Dental Injury: Teeth and Oropharynx as per pre-operative assessment

## 2021-02-07 NOTE — Progress Notes (Signed)
Assisted Dr. Miller with left, ultrasound guided, interscalene  block. Side rails up, monitors on throughout procedure. See vital signs in flow sheet. Tolerated Procedure well.  

## 2021-02-08 ENCOUNTER — Encounter (HOSPITAL_BASED_OUTPATIENT_CLINIC_OR_DEPARTMENT_OTHER): Payer: Self-pay | Admitting: Orthopaedic Surgery

## 2021-02-12 LAB — SURGICAL PATHOLOGY

## 2022-07-06 IMAGING — CT CT SHOULDER*L* W/O CM
2 series · 10 of 14 positions shown, 12 images · non-contrast
Comparison: None.

CLINICAL DATA: Left shoulder pain for 1 year. History of breast
cancer and radiation

EXAM:
CT OF THE UPPER LEFT EXTREMITY WITHOUT CONTRAST
TECHNIQUE: Multidetector CT imaging of the upper left extremity was performed
according to the standard protocol.

[Series 2: bone · axial · 0.56mm/px · z∈[-211,-63]mm · 5 of 112 slices shown, 7 images]
[im 19/112  soft-tissue]
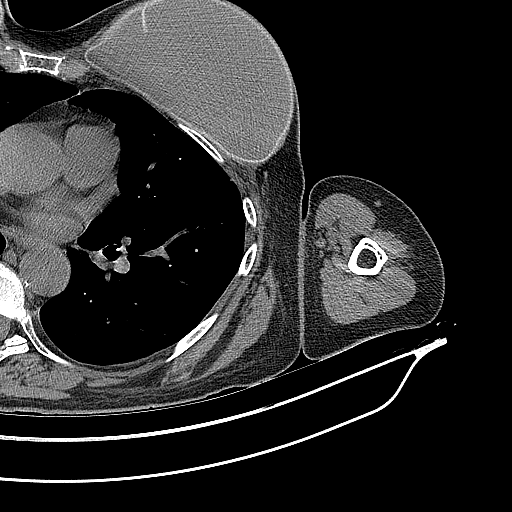
[im 19/112  bone]
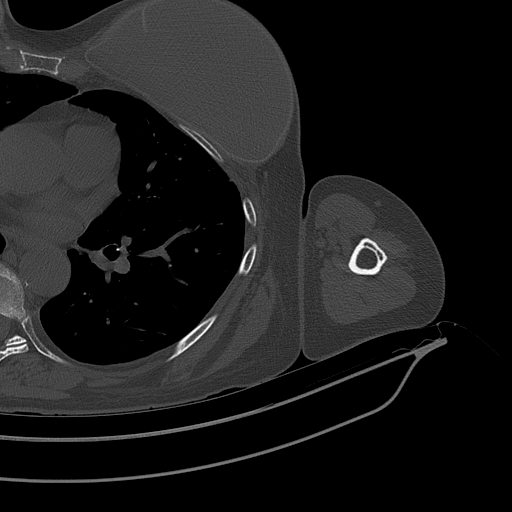
[im 38/112  bone]
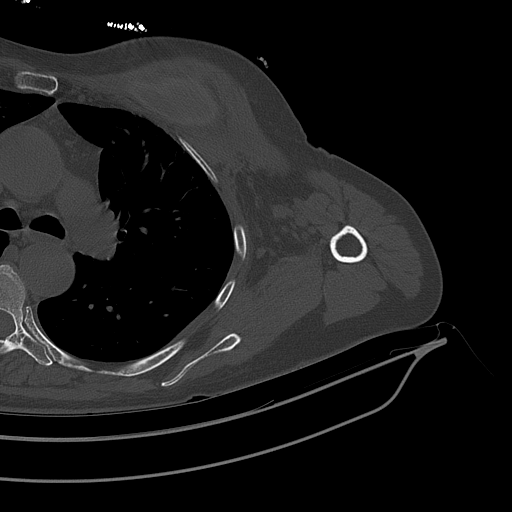
[im 56/112  bone]
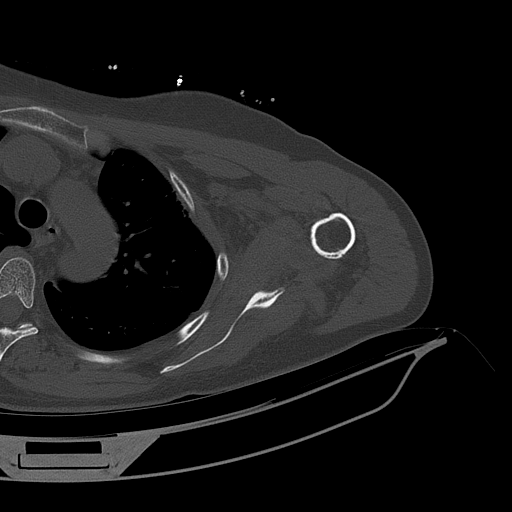
[im 75/112  bone]
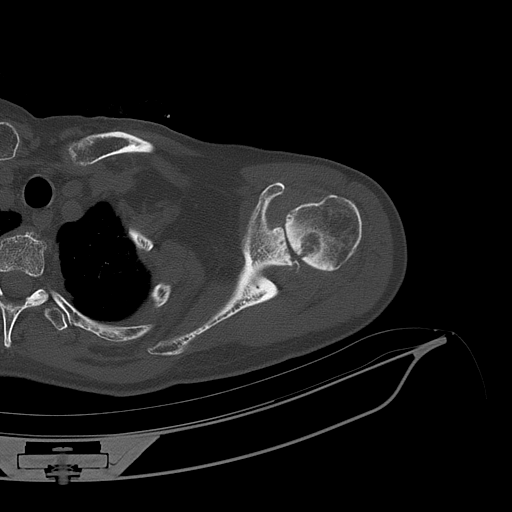
[im 93/112  soft-tissue]
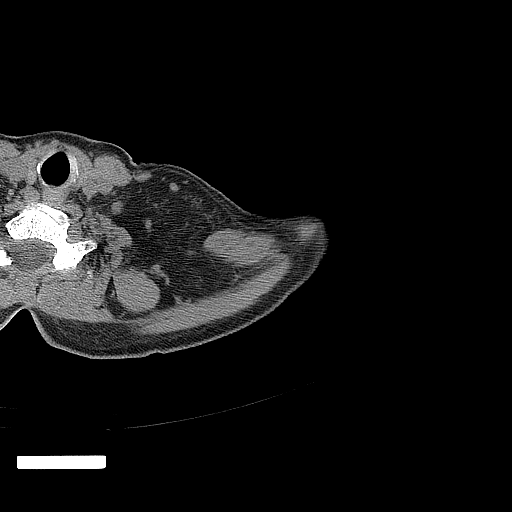
[im 93/112  bone]
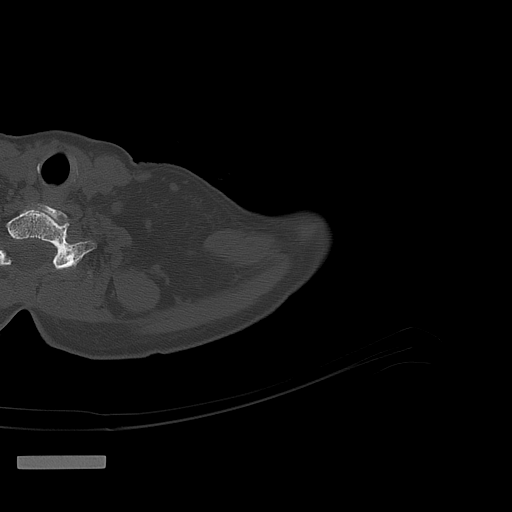

[Series 3: soft tissue · axial · 0.56mm/px · z∈[-207,-63]mm · 5 of 110 slices shown]
[im 19/110  soft-tissue]
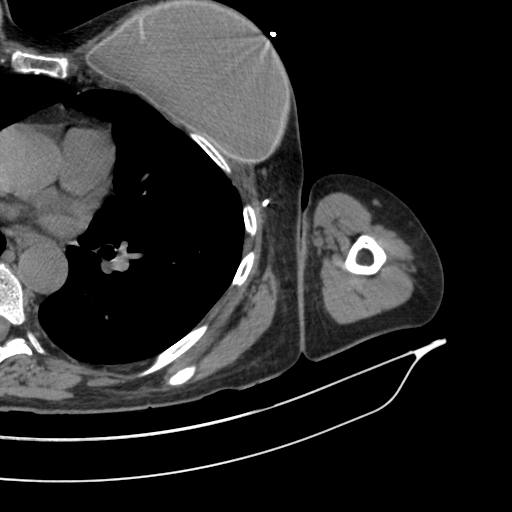
[im 37/110  soft-tissue]
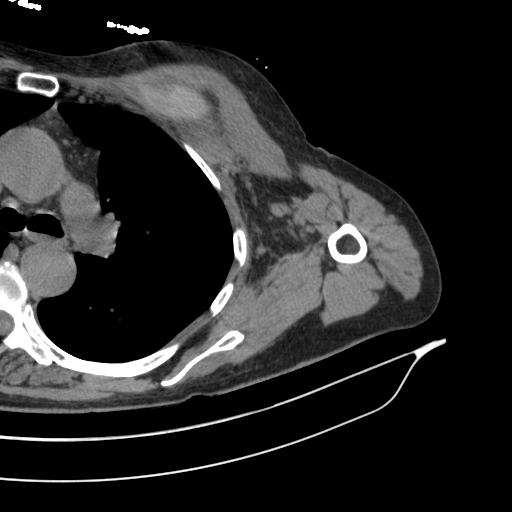
[im 55/110  soft-tissue]
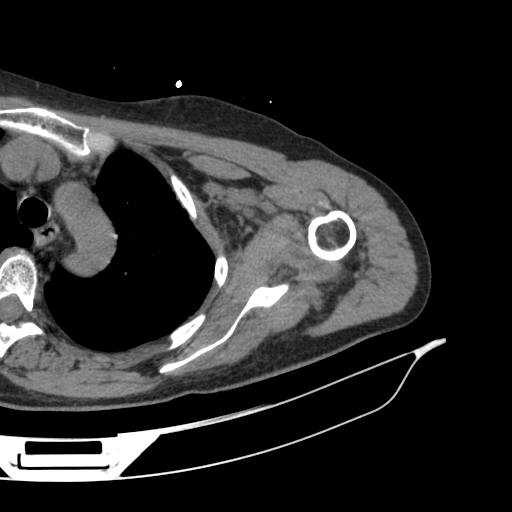
[im 73/110  soft-tissue]
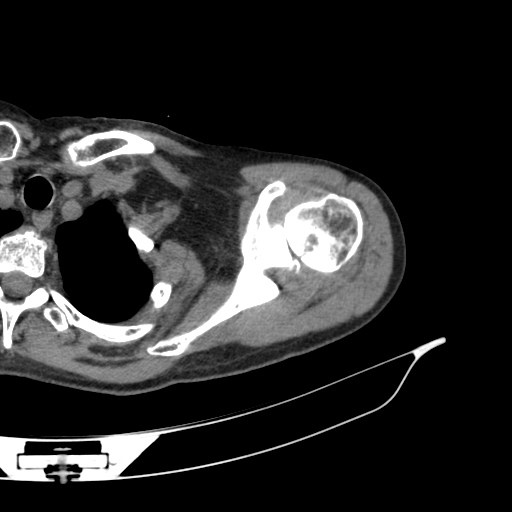
[im 91/110  soft-tissue]
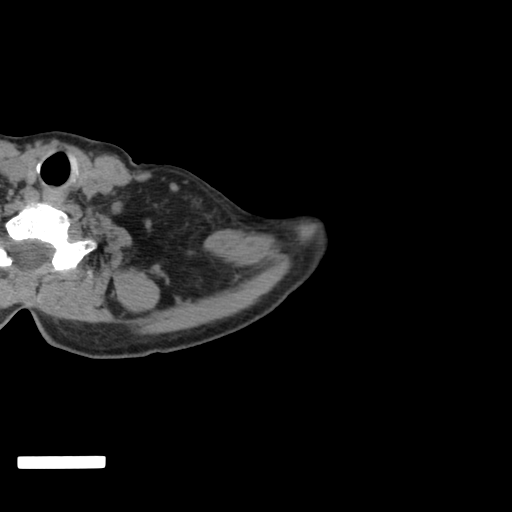

[10 of 14 positions shown; findings below may reference images not displayed]

FINDINGS: Bones/Joint/Cartilage

No acute fracture. No dislocation. Severe glenohumeral joint
osteoarthritis manifested by complete joint space loss, subchondral
sclerosis/cystic change, and marginal osteophyte formation. There
are prominent subchondral cystic changes within the glenoid, most
pronounced centrally and inferiorly. Numerous subchondral cysts
within the humeral head including a dominant 1.8 cm subchondral cyst
centrally. Mild superior glenoid retroversion. Chronic-appearing
fragmentation of the posterior glenoid rim. A small-moderate sized
glenohumeral joint effusion is evident.

Mild degenerative changes of the acromioclavicular joint. No large
subacromial-subdeltoid bursal fluid collection. Degenerative disc
disease and facet arthropathy of the visualized lower cervical
spine. Patient has previously undergone a cervical decompression.

Remote healed fractures of the anterior left second rib and
posterior left fourth rib.

Ligaments

Suboptimally assessed by CT.

Muscles and Tendons

Preserved muscle bulk of the rotator cuff musculature without
significant atrophy or fatty infiltration. Rotator cuff tendons
appear grossly intact within the limitations of CT.

Soft tissues

Pleural thickening and subpleural fibrotic changes within the
anterior aspect of the left upper lobe, which may reflect
postradiation changes. Left breast prosthesis is seen. No left
axillary lymphadenopathy. Postsurgical changes are seen within the
left axillary region.
IMPRESSION: 1. Severe glenohumeral joint osteoarthritis with prominent
subchondral cystic changes of the glenoid and humeral head.
2. Pleural thickening and subpleural fibrotic changes within the
anterior aspect of the left upper lobe, which may reflect
postradiation changes given history of breast cancer.

## 2022-07-09 ENCOUNTER — Ambulatory Visit: Payer: Medicare Other | Admitting: Podiatry

## 2022-07-09 ENCOUNTER — Ambulatory Visit (INDEPENDENT_AMBULATORY_CARE_PROVIDER_SITE_OTHER): Payer: Medicare Other

## 2022-07-09 DIAGNOSIS — M79674 Pain in right toe(s): Secondary | ICD-10-CM

## 2022-07-09 DIAGNOSIS — M2042 Other hammer toe(s) (acquired), left foot: Secondary | ICD-10-CM

## 2022-07-09 DIAGNOSIS — M79675 Pain in left toe(s): Secondary | ICD-10-CM

## 2022-07-09 DIAGNOSIS — M216X9 Other acquired deformities of unspecified foot: Secondary | ICD-10-CM | POA: Diagnosis not present

## 2022-07-09 NOTE — Progress Notes (Signed)
Subjective:   Patient ID: Pam Jarvis, female   DOB: 61 y.o.   MRN: [id number]   HPI Chief Complaint  Patient presents with   Foot Problem    Patient stated that she is having problems with the left foot, pain located on the great hallux, pain has occurred for about 10 months     62 year old female presents the office with above concerns.  She states that she previously completed to go out of town and she slipped on a slick patio when she stubbed her second toe and broke it.  Took a long time to heal and since has been crooked and swollen.  She had a back fusion about 1-1/2 years ago and afterwards she states that she had to learn to walk again and has had difficulty with coordination.  She states the toes of the left foot went to grab down with the gorilla.  She got to the corn on the left big toe he tried treating this without any improvement.   Review of Systems  All other systems reviewed and are negative.  Past Medical History:  Diagnosis Date   Cancer ([place])    left breast cancer-bil mastectomies   Hypertension    OAB (overactive bladder)    Osteoarthritis     Past Surgical History:  Procedure Laterality Date   CERVICAL SPINE SURGERY     LUMBAR FUSION     MASTECTOMY Bilateral    RECONSTRUCTION BREAST W/ LATISSIMUS DORSI FLAP     REVERSE SHOULDER ARTHROPLASTY Left 02/07/2021   Procedure: REVERSE SHOULDER ARTHROPLASTY;  Surgeon: Hiram Gash, MD;  Location: [facility];  Service: Orthopedics;  Laterality: Left;   TOTAL HIP ARTHROPLASTY Right    TOTAL KNEE ARTHROPLASTY Left      Current Outpatient Medications:    amLODipine (NORVASC) 5 MG tablet, Take 5 mg by mouth daily., Disp: , Rfl:    doxepin (SINEQUAN) 25 MG capsule, Take 25 mg by mouth at bedtime., Disp: , Rfl:    exemestane (AROMASIN) 25 MG tablet, Take 25 mg by mouth daily after breakfast., Disp: , Rfl:    ibandronate (BONIVA) 150 MG tablet, Take 150 mg by mouth every 30 (thirty) days. Take in  the morning with a full glass of water, on an empty stomach, and do not take anything else by mouth or lie down for the next 30 min., Disp: , Rfl:    metoprolol succinate (TOPROL-XL) 25 MG 24 hr tablet, Take 12.5 mg by mouth daily., Disp: , Rfl:    oxybutynin (DITROPAN-XL) 10 MG 24 hr tablet, Take 10 mg by mouth at bedtime., Disp: , Rfl:    triamterene-hydrochlorothiazide (DYAZIDE) 37.5-25 MG capsule, Take 1 capsule by mouth daily., Disp: , Rfl:    venlafaxine (EFFEXOR) 100 MG tablet, Take 225 mg by mouth daily., Disp: , Rfl:    zolpidem (AMBIEN) 10 MG tablet, Take 20 mg by mouth at bedtime as needed for sleep., Disp: , Rfl:    gabapentin (NEURONTIN) 100 MG capsule, Take 1 capsule (100 mg total) by mouth 3 (three) times daily. For pain, Disp: 90 capsule, Rfl: 0   quinapril (ACCUPRIL) 20 MG tablet, Take 20 mg by mouth at bedtime., Disp: , Rfl:   Allergies  Allergen Reactions   Ace Inhibitors Anaphylaxis and Swelling    Other Reaction(s): Angioedema (ALLERGY/intolerance)  Angioedema and dry cough 03/05/2021           Objective:  Physical Exam  General: AAO x3, NAD  Dermatological: Mild erythema dorsal hallux IPJ from irritation of there is no skin breakdown or warmth.  No open lesions.  Vascular: Dorsalis Pedis artery and Posterior Tibial artery pedal pulses are 2/4 bilateral with immedate capillary fill time.  There is no pain with calf compression, swelling, warmth, erythema.   Neruologic: Grossly intact via light touch bilateral.   Musculoskeletal: Hallux malleus is present as well as hammertoe deformities which is rigid on the left second toe.  Flexible hammertoes of the lesser digits 3 through 5 on the left foot.  Gait: Unassisted, Nonantalgic.       Assessment:   Hammertoe deformities, history of previous fracture left second toe     Plan:  -Treatment options discussed including all alternatives, risks, and complications -Etiology of symptoms were discussed -X-rays  were obtained and reviewed with the patient.  3 views of the left foot were obtained.  No evidence of acute fracture.  Hammertoes are present.  Arthritic changes present of the left second PIPJ. -We discussed both conservative as well as surgical treatment options. -Ortho conservative treatment.  Discussed using good arch support.  Dispensed toe crest and different hammertoe pads.  Discussed exercises of the toes.  Symptoms persist or worsen consider surgical intervention.  Trula Slade DPM

## 2022-07-09 NOTE — Patient Instructions (Signed)
Hammer Toe Hammer toe is a change in the shape, or a deformity, of the toe. The deformity causes the middle joint of the toe to stay bent. Hammer toe starts gradually. At first, the toe can be straightened. Then over time, the toe deformity becomes stiff, inflexible, and permanently bent. Hammer toe usually affects the second, third, or fourth toe. A hammer toe causes pain, especially when wearing shoes. Corns and calluses can result from the toe rubbing against the inside of the shoe. Early treatments to keep the toe straight may relieve pain. As the deformity of the toe becomes stiff and permanent, surgery may be needed to straighten the toe. What are the causes? This condition is caused by abnormal bending of the toe joint that is closest to your foot. Over time, the toe bending downward pulls on the muscles and connections (tendons) of the toe joint, making them weak and stiff. Wearing shoes that are too narrow in the toe box and do not allow toes to fully straighten can cause this condition. What increases the risk? You are more likely to develop this condition if you: Are an older female. Wear shoes that are too small, or wear high-heeled shoes that pinch your toes. Have a second toe that is longer than your big toe (first toe). Injure your foot or toe. Have arthritis, or have a nerve or muscle disorder. Have diabetes or a condition known as Charcot joint, which may cause you to walk abnormally. Have a family history of hammer toe. Are a ballet dancer. What are the signs or symptoms? Pain and deformity of the toe are the main symptoms of this condition. The pain is worse when wearing shoes, walking, or running. Other symptoms may include: A thickened patch of skin, called a corn or callus, that forms over the top of the bent part of the toe or between the toes. Redness and a burning feeling on the bent toe. An open sore that forms on the top of the bent toe. Not being able to straighten  the affected toe. How is this diagnosed? This condition is diagnosed based on your symptoms and a physical exam. During the exam, your health care provider will try to straighten your toe to see how stiff the deformity is. You may also have tests, such as: A blood test to check for rheumatoid arthritis or diabetes. An X-ray to show how severe the toe deformity is. How is this treated? Treatment for this condition depends on whether the toe is flexible or deformed and no longer moveable. In less severe cases, a hammer toe can be straightened without surgery. These treatments include: Taping the toe into a straightened position. Using pads and cushions to protect the bent toe. Wearing shoes that provide enough room for the toes. Doing toe-stretching exercises at home. Taking an NSAID, such as ibuprofen, to reduce pain and swelling. Using special orthotics or insoles for pain relief and to improve walking. If these treatments do not help or the toe has a severe deformity and cannot be straightened, surgery is the next option. The most common surgeries used to straighten a hammer toe include: Arthroplasty or osteotomy. Part of the toe joint is reconstructed or removed, which allows the toe to straighten. Fusion. Cartilage between the two bones of the joint is taken out, and the bones are fused together into one longer bone. Implantation. Part of the bone is removed and replaced with an implant to allow the toe to move again. Flexor tendon transfer.   The tendons that curl the toes down (flexor tendons) are repositioned. Follow these instructions at home: Take over-the-counter and prescription medicines only as told by your health care provider. Do toe-straightening and stretching exercises as told by your health care provider. Keep all follow-up visits. This is important. How is this prevented? Wear shoes that fit properly and give your toes enough room. Shoes should not cause pain. Buy shoes at  the end of the day to make sure they fit well, since your foot may swell during the day. Make sure they are comfortable before you buy them. As you age, your shoe size might change, including the width. Measure both feet and buy shoes for the larger foot. A shoe repair store might be able to stretch shoes that feel tight in spots. Do not wear high-heeled shoes or shoes with pointed toes. Contact a health care provider if: Your pain gets worse. Your toe becomes red or swollen. You develop an open sore on your toe. Summary Hammer toe is a condition that gradually causes your toe to become bent and stiff. Hammer toe can be treated by taping the toe into a straightened position and doing toe-stretching exercises. If these treatments do not help, surgery may be needed. To prevent this condition, wear shoes that fit properly, give your toes enough room, and do not cause pain. This information is not intended to replace advice given to you by your health care provider. Make sure you discuss any questions you have with your health care provider. Document Revised: 07/15/2019 Document Reviewed: 07/15/2019 [employer] Patient Education  2023 [employer]  

## 2022-10-25 IMAGING — CR DG SHOULDER 1V*L*
1 series · 1 of 1 positions shown · non-contrast
Comparison: CT 12/16/2020

CLINICAL DATA: Pain, postop

EXAM:
LEFT SHOULDER

[shoulder ap]
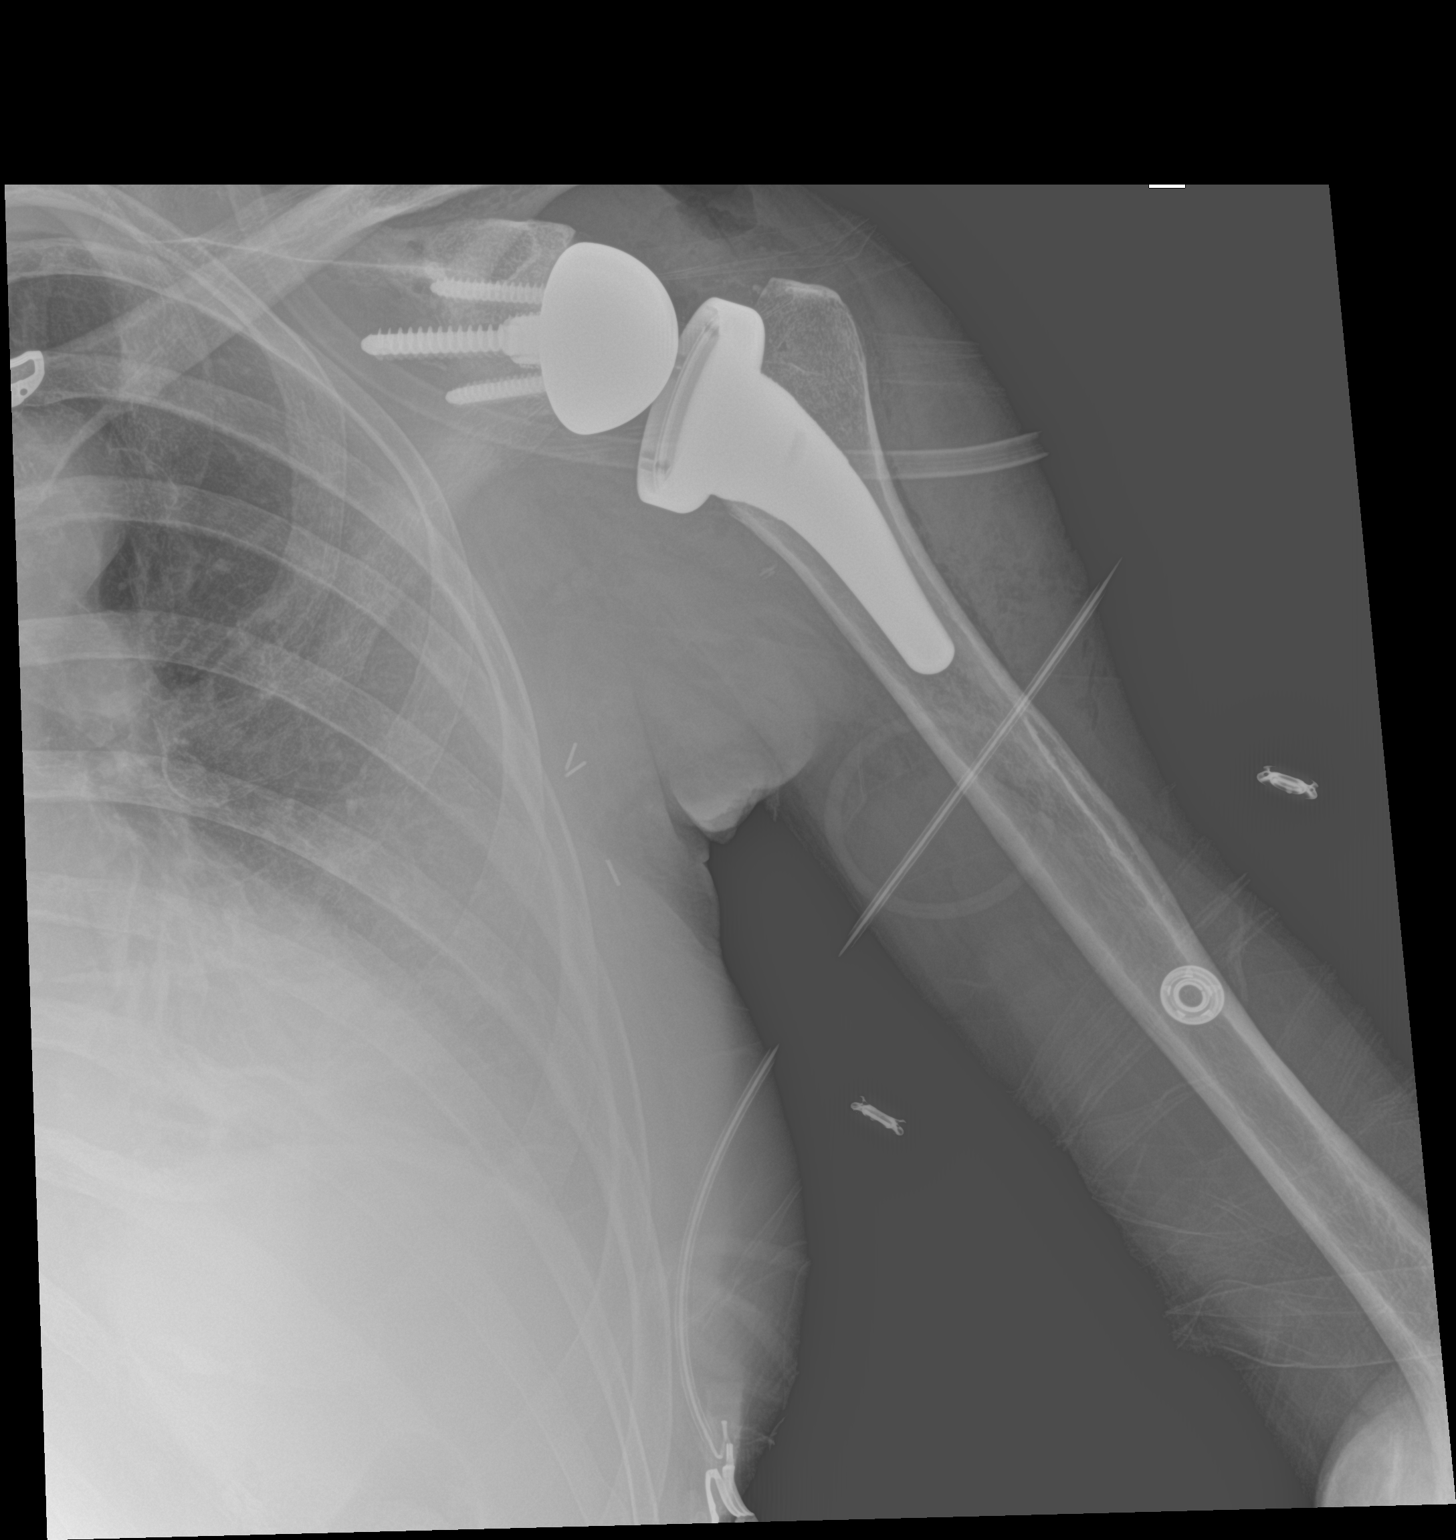

[1 of 1 positions shown; findings below may reference images not displayed]

FINDINGS: Interval arthroplasty with humeral and scapular components project
in expected location. No fracture or dislocation.
IMPRESSION: Left shoulder arthroplasty without apparent complication.

## 2022-11-29 ENCOUNTER — Other Ambulatory Visit: Payer: Self-pay | Admitting: Orthopaedic Surgery

## 2022-11-29 DIAGNOSIS — Z01818 Encounter for other preprocedural examination: Secondary | ICD-10-CM

## 2022-12-06 ENCOUNTER — Ambulatory Visit
Admission: RE | Admit: 2022-12-06 | Discharge: 2022-12-06 | Disposition: A | Discharge status: Home / Self Care | Payer: Medicare Other | Source: Ambulatory Visit | Attending: Orthopaedic Surgery | Admitting: Orthopaedic Surgery

## 2022-12-06 DIAGNOSIS — Z01818 Encounter for other preprocedural examination: Secondary | ICD-10-CM

## 2023-07-07 ENCOUNTER — Encounter: Payer: Self-pay | Admitting: Family Medicine

## 2023-07-15 ENCOUNTER — Ambulatory Visit: Admitting: Podiatry

## 2023-10-30 ENCOUNTER — Ambulatory Visit: Admitting: Podiatry

## 2023-10-30 ENCOUNTER — Ambulatory Visit (INDEPENDENT_AMBULATORY_CARE_PROVIDER_SITE_OTHER)

## 2023-10-30 ENCOUNTER — Encounter: Payer: Self-pay | Admitting: Podiatry

## 2023-10-30 DIAGNOSIS — M2042 Other hammer toe(s) (acquired), left foot: Secondary | ICD-10-CM

## 2023-10-31 NOTE — Progress Notes (Signed)
 Subjective:   Patient ID: Pam Jarvis, female   DOB: 63 y.o.   MRN: [id number]   HPI Patient states she is having a lot of problems with hammertoe deformity left over right foot with history of back surgery and states she feels unstable on her left side.  States that the toes are gradually gotten worse and more aggravating and irritating and she had looked at it last year but her schedule did not allow her to do anything and she is also had shoulder replacement hip replacement and needs knee replacement.  Patient does not smoke   ROS      Objective:  Physical Exam  Neurovascular status was found to be intact muscle strength adequate range of motion adequate with patient found to have digital deformity of the hallux 2nd, 3rd and 4th toe left foot with distal keratotic lesions and lifting of the lesser digits with redness and discomfort in the fourth toe finds to be lying underneath the third toe.  Patient does have good digital perfusion well-oriented     Assessment:  Hammertoe deformity 2nd, 3rd and 4th and at the interphalangeal joint of the left big toe with pain and instability that she feels is partially related to the structure of the toes     Plan:  H&P x-ray taken reviewed condition and I did discuss digital fusion of digits 1-3 left and distal arthroplasty digit 4.  I did review this with her and the surgery and recovery and she would like to get this done sometime in the fall.  She is going to look at her schedule and then get a date and I will see her prior to go over everything in detail.  X-rays dated today indicated there is a displacement at the inner phalangeal joint left big toe moderate structural deformity of the 2nd, 3rd and 4th digits left

## 2023-12-14 ENCOUNTER — Encounter (HOSPITAL_BASED_OUTPATIENT_CLINIC_OR_DEPARTMENT_OTHER): Payer: Self-pay

## 2023-12-14 ENCOUNTER — Emergency Department (HOSPITAL_BASED_OUTPATIENT_CLINIC_OR_DEPARTMENT_OTHER)

## 2023-12-14 ENCOUNTER — Other Ambulatory Visit: Payer: Self-pay

## 2023-12-14 ENCOUNTER — Emergency Department (HOSPITAL_BASED_OUTPATIENT_CLINIC_OR_DEPARTMENT_OTHER)
Admission: EM | Admit: 2023-12-14 | Discharge: 2023-12-14 | Disposition: A | Discharge status: Home / Self Care | Attending: Emergency Medicine | Admitting: Emergency Medicine

## 2023-12-14 DIAGNOSIS — S0181XA Laceration without foreign body of other part of head, initial encounter: Secondary | ICD-10-CM | POA: Insufficient documentation

## 2023-12-14 DIAGNOSIS — Z23 Encounter for immunization: Secondary | ICD-10-CM | POA: Insufficient documentation

## 2023-12-14 DIAGNOSIS — W19XXXA Unspecified fall, initial encounter: Secondary | ICD-10-CM

## 2023-12-14 DIAGNOSIS — S41011A Laceration without foreign body of right shoulder, initial encounter: Secondary | ICD-10-CM | POA: Insufficient documentation

## 2023-12-14 DIAGNOSIS — W010XXA Fall on same level from slipping, tripping and stumbling without subsequent striking against object, initial encounter: Secondary | ICD-10-CM | POA: Diagnosis not present

## 2023-12-14 DIAGNOSIS — Z96611 Presence of right artificial shoulder joint: Secondary | ICD-10-CM | POA: Diagnosis not present

## 2023-12-14 MED ORDER — TETANUS-DIPHTH-ACELL PERTUSSIS 5-2.5-18.5 LF-MCG/0.5 IM SUSY
0.5000 mL | PREFILLED_SYRINGE | Freq: Once | INTRAMUSCULAR | Status: AC
  Administered 2023-12-14: 0.5 mL via INTRAMUSCULAR
  Filled 2023-12-14: qty 0.5

## 2023-12-14 MED ORDER — LORAZEPAM 1 MG PO TABS
1.0000 mg | ORAL_TABLET | Freq: Once | ORAL | Status: AC
  Administered 2023-12-14: 1 mg via ORAL
  Filled 2023-12-14: qty 1

## 2023-12-14 MED ORDER — OXYMETAZOLINE HCL 0.05 % NA SOLN
1.0000 | Freq: Once | NASAL | Status: AC
  Administered 2023-12-14: 1 via NASAL
  Filled 2023-12-14: qty 30

## 2023-12-14 MED ORDER — LORAZEPAM 1 MG PO TABS: 2.0000 mg | ORAL_TABLET | Freq: Once | ORAL | Status: DC

## 2023-12-14 MED ORDER — HYDROCODONE-ACETAMINOPHEN 5-325 MG PO TABS
1.0000 | ORAL_TABLET | Freq: Once | ORAL | Status: AC
  Administered 2023-12-14: 1 via ORAL
  Filled 2023-12-14: qty 1

## 2023-12-14 MED ORDER — LIDOCAINE-EPINEPHRINE-TETRACAINE (LET) TOPICAL GEL
3.0000 mL | Freq: Once | TOPICAL | Status: AC
  Administered 2023-12-14: 3 mL via TOPICAL
  Filled 2023-12-14: qty 3

## 2023-12-14 MED ORDER — LIDOCAINE-EPINEPHRINE (PF) 2 %-1:200000 IJ SOLN
20.0000 mL | Freq: Once | INTRAMUSCULAR | Status: AC
  Administered 2023-12-14: 20 mL
  Filled 2023-12-14: qty 20

## 2023-12-14 MED ORDER — BACITRACIN ZINC 500 UNIT/GM EX OINT: TOPICAL_OINTMENT | Freq: Two times a day (BID) | CUTANEOUS | Status: DC

## 2023-12-14 MED ORDER — HYDROCODONE-ACETAMINOPHEN 5-325 MG PO TABS: 1.0000 | ORAL_TABLET | Freq: Four times a day (QID) | ORAL | 0 refills | Status: AC | PRN

## 2023-12-14 NOTE — Discharge Instructions (Addendum)
 Sutured repair Keep the laceration site dry for the next 24 hours and leave the dressing in place. After 24 hours you may remove the dressing and gently clean the laceration site with antibacterial soap and warm water. Do not scrub the area. Do not soak the area and water for long periods of time. Don't use hydrogen peroxide, iodine-based solutions, or alcohol, which can slow healing, and will probably be painful! Apply topical bacitracin  1-2 times per day for the next 3-5 days. Return to the emergency department (or PCP) in 6 - 8 days for removal of the sutures.  You should return sooner for any signs of infection which would include increased redness around the wound, increased swelling, new drainage of yellow pus.   Also sent a few tablets of Norco for pain.  For your nosebleed if it returns you can try phenylephrine  at home.  If it is persistent would recommend returning to the ED for further evaluation.  Please follow-up with your PCP.

## 2023-12-14 NOTE — ED Triage Notes (Signed)
 Patient was carrying a box when she tripped on a trailer hitch and fell face first. She states she did lose consciousness but she is not on any anticoagulants. She has a laceration on the right eye brow. Bleeding is controled. She also has a laceration on her chin.

## 2023-12-14 NOTE — ED Notes (Signed)
ED Provider at bedside for suture repair. 

## 2023-12-14 NOTE — ED Provider Notes (Signed)
 [place] [facility]ERG[facility]Y DEPARTMENT AT [facility] Provider Note   CSN: [id number] Arrival date & time: 12/14/23  1305     Patient presents with: Fall, Laceration, and Head Injury  HPI Pam Jarvis is a 63 y.o. female presenting for head injury after fall. Occurred about 30 minutes ago.  She states she was carrying boxes in the driveway and tripped over the trailer hitch and fell face first. She states she may have lost consciousness for a few seconds. She is reporting laceration to the right eyebrow and chin.  Denies neck pain.  She also reports that she has been bleeding from her nose since the accident as well. Does also report a skin tear in the right shoulder with some tenderness in the area.  Reports to shoulder replacement a few years ago.    Fall  Laceration Head Injury      Prior to Admission medications   Medication Sig Start Date End Date Taking? Authorizing Provider  HYDROcodone -acetaminophen  (NORCO/VICODIN) 5-325 MG tablet Take 1 tablet by mouth every 6 (six) hours as needed for up to 8 doses. 12/14/23  Yes Jaxiel Kines K, PA-C  amLODipine (NORVASC) 5 MG tablet Take 5 mg by mouth daily.    [provider]  doxepin (SINEQUAN) 25 MG capsule Take 25 mg by mouth at bedtime.    [provider]  exemestane (AROMASIN) 25 MG tablet Take 25 mg by mouth daily after breakfast.    [provider]  gabapentin  (NEURONTIN ) 100 MG capsule Take 1 capsule (100 mg total) by mouth 3 (three) times daily. For pain 02/07/21   Jennye Aleck SAILOR, PA-C  ibandronate (BONIVA) 150 MG tablet Take 150 mg by mouth every 30 (thirty) days. Take in the morning with a full glass of water, on an empty stomach, and do not take anything else by mouth or lie down for the next 30 min.    [provider]  metoprolol succinate (TOPROL-XL) 25 MG 24 hr tablet Take 12.5 mg by mouth daily.    [provider]  oxybutynin (DITROPAN-XL) 10 MG 24 hr tablet Take 10 mg  by mouth at bedtime.    [provider]  quinapril (ACCUPRIL) 20 MG tablet Take 20 mg by mouth at bedtime.    [provider]  triamterene-hydrochlorothiazide (DYAZIDE) 37.5-25 MG capsule Take 1 capsule by mouth daily.    [provider]  venlafaxine (EFFEXOR) 100 MG tablet Take 225 mg by mouth daily.    [provider]  zolpidem (AMBIEN) 10 MG tablet Take 20 mg by mouth at bedtime as needed for sleep.    [provider]    Allergies: Ace inhibitors    Review of Systems See HPI  Updated Vital Signs BP 120/80 (BP Location: Right Arm)   Pulse 65   Temp 97.7 F (36.5 C) (Oral)   Resp 12   SpO2 98%   Physical Exam Vitals and nursing note reviewed.  HENT:     Head: Normocephalic and atraumatic.      Nose:     Comments: Dried blood noted in both nares, mild edema of the nose    Mouth/Throat:     Mouth: Mucous membranes are moist.  Eyes:     General:        Right eye: No discharge.        Left eye: No discharge.     Extraocular Movements: Extraocular movements intact.     Conjunctiva/sclera: Conjunctivae normal.  Cardiovascular:  Rate and Rhythm: Normal rate and regular rhythm.     Pulses: Normal pulses.     Heart sounds: Normal heart sounds.  Pulmonary:     Effort: Pulmonary effort is normal.     Breath sounds: Normal breath sounds.  Abdominal:     General: Abdomen is flat.     Palpations: Abdomen is soft.  Musculoskeletal:     Comments: Small skin tear to the right shoulder  Skin:    General: Skin is warm and dry.  Neurological:     General: No focal deficit present.  Psychiatric:        Mood and Affect: Mood normal.     (all labs ordered are listed, but only abnormal results are displayed) Labs Reviewed - No data to display  EKG: None  Radiology: CT Head Wo Contrast Result Date: 12/14/2023 CLINICAL DATA:  Polytrauma, blunt; Facial trauma, blunt EXAM: CT HEAD WITHOUT CONTRAST CT MAXILLOFACIAL WITHOUT CONTRAST  CT CERVICAL SPINE WITHOUT CONTRAST TECHNIQUE: Multidetector CT imaging of the head, cervical spine, and maxillofacial structures were performed using the standard protocol without intravenous contrast. Multiplanar CT image reconstructions of the cervical spine and maxillofacial structures were also generated. RADIATION DOSE REDUCTION: This exam was performed according to the departmental dose-optimization program which includes automated exposure control, adjustment of the mA and/or kV according to patient size and/or use of iterative reconstruction technique. FINDINGS: CT HEAD FINDINGS Brain: No evidence of acute infarction, hemorrhage, hydrocephalus, extra-axial collection or mass lesion/mass effect. Vascular: No hyperdense vessel. Skull: No acute fracture. Other: No mastoid effusions. CT MAXILLOFACIAL FINDINGS Osseous: No fracture or mandibular dislocation. No destructive process. Orbits: Negative. No traumatic or inflammatory finding. Sinuses: Clear. Soft tissues: Negative. CT CERVICAL SPINE FINDINGS Alignment: Approximately 3 mm of anterolisthesis of C3 on C4 and C4 on C5, likely degenerative facet mediated given severe degenerative changes. No evidence of traumatic malalignment. Levocurvature. Skull base and vertebrae: No acute fracture. Soft tissues and spinal canal: No prevertebral fluid or swelling. No visible canal hematoma. Disc levels: Severe multilevel degenerative change including degenerative disc disease is greatest at C5-C6.Probably severe foraminal stenosis on the left at C5-C6. Upper chest: Lung apices are clear. IMPRESSION: 1. No evidence of acute abnormality intracranially, in the face, or in the cervical spine. 2. 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 Probably severe foraminal stenosis on the left at C5-C6. MRI could further evaluate if clinically warranted. Electronically Signed   By: Gilmore GORMAN Molt M.D.    On: 12/14/2023 16:51   CT Maxillofacial WO CM Result Date: 12/14/2023 CLINICAL DATA:  Polytrauma, blunt; Facial trauma, blunt EXAM: CT HEAD WITHOUT CONTRAST CT MAXILLOFACIAL WITHOUT CONTRAST CT CERVICAL SPINE WITHOUT CONTRAST TECHNIQUE: Multidetector CT imaging of the head, cervical spine, and maxillofacial structures were performed using the standard protocol without intravenous contrast. Multiplanar CT image reconstructions of the cervical spine and maxillofacial structures were also generated. RADIATION DOSE REDUCTION: This exam was performed according to the departmental dose-optimization program which includes automated exposure control, adjustment of the mA and/or kV according to patient size and/or use of iterative reconstruction technique. FINDINGS: CT HEAD FINDINGS Brain: No evidence of acute infarction, hemorrhage, hydrocephalus, extra-axial collection or mass lesion/mass effect. Vascular: No hyperdense vessel. Skull: No acute fracture. Other: No mastoid effusions. CT MAXILLOFACIAL FINDINGS Osseous: No fracture or mandibular dislocation. No destructive process. Orbits: Negative. No traumatic or inflammatory finding. Sinuses: Clear. Soft tissues: Negative. CT CERVICAL SPINE FINDINGS Alignment: Approximately  3 mm of anterolisthesis of C3 on C4 and C4 on C5, likely degenerative facet mediated given severe degenerative changes. No evidence of traumatic malalignment. Levocurvature. Skull base and vertebrae: No acute fracture. Soft tissues and spinal canal: No prevertebral fluid or swelling. No visible canal hematoma. Disc levels: Severe multilevel degenerative change including degenerative disc disease is greatest at C5-C6.Probably severe foraminal stenosis on the left at C5-C6. Upper chest: Lung apices are clear. IMPRESSION: 1. No evidence of acute abnormality intracranially, in the face, or in the cervical spine. 2. Severe multilevel degenerative change in the cervical spine including degenerative  anterolisthesis of C3 on C4 and C4 on C5. Probably severe foraminal stenosis on the left at C5-C6. MRI could further evaluate if clinically warranted. Electronically Signed   By: Gilmore GORMAN Molt M.D.   On: 12/14/2023 16:51   CT Cervical Spine Wo Contrast Result Date: 12/14/2023 CLINICAL DATA:  Polytrauma, blunt; Facial trauma, blunt EXAM: CT HEAD WITHOUT CONTRAST CT MAXILLOFACIAL WITHOUT CONTRAST CT CERVICAL SPINE WITHOUT CONTRAST TECHNIQUE: Multidetector CT imaging of the head, cervical spine, and maxillofacial structures were performed using the standard protocol without intravenous contrast. Multiplanar CT image reconstructions of the cervical spine and maxillofacial structures were also generated. RADIATION DOSE REDUCTION: This exam was performed according to the departmental dose-optimization program which includes automated exposure control, adjustment of the mA and/or kV according to patient size and/or use of iterative reconstruction technique. FINDINGS: CT HEAD FINDINGS Brain: No evidence of acute infarction, hemorrhage, hydrocephalus, extra-axial collection or mass lesion/mass effect. Vascular: No hyperdense vessel. Skull: No acute fracture. Other: No mastoid effusions. CT MAXILLOFACIAL FINDINGS Osseous: No fracture or mandibular dislocation. No destructive process. Orbits: Negative. No traumatic or inflammatory finding. Sinuses: Clear. Soft tissues: Negative. CT CERVICAL SPINE FINDINGS Alignment: Approximately 3 mm of anterolisthesis of C3 on C4 and C4 on C5, likely degenerative facet mediated given severe degenerative changes. No evidence of traumatic malalignment. Levocurvature. Skull base and vertebrae: No acute fracture. Soft tissues and spinal canal: No prevertebral fluid or swelling. No visible canal hematoma. Disc levels: Severe multilevel degenerative change including degenerative disc disease is greatest at C5-C6.Probably severe foraminal stenosis on the left at C5-C6. Upper chest: Lung  apices are clear. IMPRESSION: 1. No evidence of acute abnormality intracranially, in the face, or in the cervical spine. 2. 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 Probably severe foraminal stenosis on the left at C5-C6. MRI could further evaluate if clinically warranted. Electronically Signed   By: Gilmore GORMAN Molt M.D.   On: 12/14/2023 16:51   DG Shoulder Right Result Date: 12/14/2023 CLINICAL DATA:  Fall and trauma to the right shoulder. EXAM: RIGHT SHOULDER - 2+ VIEW COMPARISON:  None Available. 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 The arthroplasty components appear intact and in anatomic alignment. There is no acute fracture or dislocation. The bones are osteopenic. Degenerative changes of the right AC joint. The soft tissues are unremarkable. IMPRESSION: 1. No acute fracture or dislocation. 2. Right shoulder arthroplasty. Electronically Signed   By: Vanetta Chou M.D.   On: 12/14/2023 15:16     .Laceration Repair  Date/Time: 12/14/2023 5:40 PM  Performed by: Lang Norleen POUR, PA-C Authorized by: Lang Norleen POUR, PA-C   Consent:    Consent obtained:  Verbal   Consent given by:  Patient   Risks, benefits, and alternatives were discussed: yes     Risks discussed:  Infection, poor cosmetic result and  need for additional repair   Alternatives discussed:  No treatment Universal protocol:    Procedure explained and questions answered to patient or proxy's satisfaction: yes     Relevant documents present and verified: yes     Test results available: yes     Patient identity confirmed:  Verbally with patient and hospital-assigned identification number Anesthesia:    Anesthesia method:  Topical application and local infiltration   Topical anesthetic:  LET   Local anesthetic:  Lidocaine  1% WITH epi Laceration details:    Location:  Face   Face location:  Chin   Length (cm):  1    Depth (mm):  4 Pre-procedure details:    Preparation:  Patient was prepped and draped in usual sterile fashion Exploration:    Hemostasis achieved with:  Direct pressure   Imaging outcome: foreign body not noted     Wound exploration: wound explored through full range of motion     Wound extent: areolar tissue violated     Contaminated: no   Treatment:    Area cleansed with:  Shur-Clens   Amount of cleaning:  Standard   Irrigation solution:  Sterile saline   Visualized foreign bodies/material removed: no     Debridement:  None   Undermining:  None Skin repair:    Repair method:  Sutures   Suture size:  5-0   Suture material:  Nylon   Suture technique:  Simple interrupted   Number of sutures:  2 Approximation:    Approximation:  Loose Repair type:    Repair type:  Intermediate Post-procedure details:    Dressing:  Non-adherent dressing   Procedure completion:  Tolerated well, no immediate complications Epistaxis Management  Date/Time: 12/14/2023 5:46 PM  Performed by: Lang Norleen POUR, PA-C Authorized by: Lang Norleen POUR, PA-C   Consent:    Consent obtained:  Verbal   Consent given by:  Patient   Risks, benefits, and alternatives were discussed: yes     Risks discussed:  Bleeding   Alternatives discussed:  No treatment Universal protocol:    Procedure explained and questions answered to patient or proxy's satisfaction: yes     Relevant documents present and verified: yes     Patient identity confirmed:  Verbally with patient and hospital-assigned identification number Anesthesia:    Anesthesia method:  None Procedure details:    Treatment site:  R anterior and L anterior   Treatment method:  Anterior pack (Nasal clamp and phenylephrine )   Treatment complexity:  Limited Post-procedure details:    Assessment:  Bleeding stopped   Procedure completion:  Tolerated well, no immediate complications    Medications Ordered in the ED  HYDROcodone -acetaminophen   (NORCO/VICODIN) 5-325 MG per tablet 1 tablet (1 tablet Oral Given 12/14/23 1345)  lidocaine -EPINEPHrine -tetracaine  (LET) topical gel (3 mLs Topical Given 12/14/23 1437)  lidocaine -EPINEPHrine  (XYLOCAINE  W/EPI) 2 %-1:200000 (PF) injection 20 mL (20 mLs Infiltration Given by Other 12/14/23 1437)  Tdap (BOOSTRIX ) injection 0.5 mL (0.5 mLs Intramuscular Given 12/14/23 1436)  LORazepam  (ATIVAN ) tablet 1 mg (1 mg Oral Given 12/14/23 1603)  oxymetazoline  (AFRIN) 0.05 % nasal spray 1 spray (1 spray Each Nare Given 12/14/23 1744)    Clinical Course as of 12/14/23 1749  Sun Dec 14, 2023  1548 CT Maxillofacial WO CM [JR]    Clinical Course User Index [JR] Lang Norleen POUR, PA-C  Medical Decision Making Amount and/or Complexity of Data Reviewed Radiology: ordered. Decision-making details documented in ED Course.  Risk OTC drugs. Prescription drug management.   63 year old well-appearing female presenting for mechanical fall and head injury.  Exam notable for laceration to the left side of the chin, abrasion over the right eyebrow and skin tear to the right shoulder.  CT scans and x-ray were negative for acute injuries.  See details above.  Laceration repair went well and was well-tolerated of the chin.  Also cleaned and irrigated the abrasion of the right eyebrow.  Nose started to rebleed a tiny bit of blood.  Initially held pressure with nasal clamp and then tried phenylephrine  with gauze.  Hemostasis achieved.  Given reassuring CT scans and x-rays and normal vital signs and patient stating she is feeling better after pain medicine, feel that she is safe to go home.  Advised her to follow-up with her PCP.  Discussed return precautions.     Final diagnoses:  Fall, initial encounter  Chin laceration, initial encounter    ED Discharge Orders          Ordered    HYDROcodone -acetaminophen  (NORCO/VICODIN) 5-325 MG tablet  Every 6 hours PRN        12/14/23 1749                Vrishank Moster K, PA-C 12/14/23 1750    Emil Share, DO 12/15/23 [phone]

## 2023-12-14 NOTE — ED Notes (Signed)

## 2023-12-14 NOTE — ED Notes (Signed)
 Patient transported to CT

## 2024-02-10 ENCOUNTER — Other Ambulatory Visit: Payer: Self-pay | Admitting: Physician Assistant

## 2024-02-10 DIAGNOSIS — M48061 Spinal stenosis, lumbar region without neurogenic claudication: Secondary | ICD-10-CM

## 2024-02-18 ENCOUNTER — Ambulatory Visit
Admission: RE | Admit: 2024-02-18 | Discharge: 2024-02-18 | Disposition: A | Discharge status: Home / Self Care | Source: Ambulatory Visit | Attending: Physician Assistant | Admitting: Physician Assistant

## 2024-02-18 DIAGNOSIS — M48061 Spinal stenosis, lumbar region without neurogenic claudication: Secondary | ICD-10-CM
